# Patient Record
Sex: Female | Born: 1960 | Race: Black or African American | Hispanic: No | Smoking: Never smoker
Health system: Southern US, Community
[De-identification: ages and names within clinical notes are randomized; demographics above are authoritative.]

## PROBLEM LIST (undated history)

## (undated) DIAGNOSIS — I10 Essential (primary) hypertension: Secondary | ICD-10-CM

## (undated) DIAGNOSIS — E785 Hyperlipidemia, unspecified: Secondary | ICD-10-CM

## (undated) DIAGNOSIS — E669 Obesity, unspecified: Secondary | ICD-10-CM

## (undated) HISTORY — PX: OTHER SURGICAL HISTORY: SHX169

## (undated) HISTORY — PX: TONSILECTOMY, ADENOIDECTOMY, BILATERAL MYRINGOTOMY AND TUBES: SHX2538

## (undated) HISTORY — DX: Essential (primary) hypertension: I10

## (undated) HISTORY — PX: CHOLECYSTECTOMY: SHX55

## (undated) HISTORY — DX: Hyperlipidemia, unspecified: E78.5

## (undated) HISTORY — DX: Obesity, unspecified: E66.9

---

## 1999-03-15 ENCOUNTER — Ambulatory Visit: Admission: RE | Admit: 1999-03-15 | Discharge: 1999-03-15 | Payer: Self-pay | Admitting: Otolaryngology

## 1999-04-28 ENCOUNTER — Encounter: Payer: Self-pay | Admitting: Otolaryngology

## 1999-04-30 ENCOUNTER — Inpatient Hospital Stay (HOSPITAL_COMMUNITY): Admission: RE | Admit: 1999-04-30 | Discharge: 1999-05-02 | Payer: Self-pay | Admitting: Otolaryngology

## 1999-04-30 ENCOUNTER — Encounter (INDEPENDENT_AMBULATORY_CARE_PROVIDER_SITE_OTHER): Payer: Self-pay | Admitting: Specialist

## 1999-09-02 ENCOUNTER — Ambulatory Visit (HOSPITAL_BASED_OUTPATIENT_CLINIC_OR_DEPARTMENT_OTHER): Admission: RE | Admit: 1999-09-02 | Discharge: 1999-09-02 | Payer: Self-pay | Admitting: Otolaryngology

## 1999-11-23 ENCOUNTER — Ambulatory Visit (HOSPITAL_BASED_OUTPATIENT_CLINIC_OR_DEPARTMENT_OTHER): Admission: RE | Admit: 1999-11-23 | Discharge: 1999-11-23 | Payer: Self-pay | Admitting: Otolaryngology

## 2001-05-18 ENCOUNTER — Other Ambulatory Visit: Admission: RE | Admit: 2001-05-18 | Discharge: 2001-05-18 | Payer: Self-pay | Admitting: Family Medicine

## 2001-07-12 ENCOUNTER — Encounter: Payer: Self-pay | Admitting: Family Medicine

## 2001-07-12 ENCOUNTER — Ambulatory Visit (HOSPITAL_COMMUNITY): Admission: RE | Admit: 2001-07-12 | Discharge: 2001-07-12 | Payer: Self-pay | Admitting: Family Medicine

## 2001-11-27 ENCOUNTER — Encounter: Payer: Self-pay | Admitting: Family Medicine

## 2001-11-27 ENCOUNTER — Ambulatory Visit (HOSPITAL_COMMUNITY): Admission: RE | Admit: 2001-11-27 | Discharge: 2001-11-27 | Payer: Self-pay | Admitting: Family Medicine

## 2001-12-03 ENCOUNTER — Encounter: Payer: Self-pay | Admitting: Family Medicine

## 2001-12-03 ENCOUNTER — Encounter (HOSPITAL_COMMUNITY): Admission: RE | Admit: 2001-12-03 | Discharge: 2002-01-02 | Payer: Self-pay | Admitting: Family Medicine

## 2002-01-18 ENCOUNTER — Observation Stay (HOSPITAL_COMMUNITY): Admission: RE | Admit: 2002-01-18 | Discharge: 2002-01-19 | Payer: Self-pay | Admitting: General Surgery

## 2002-03-21 ENCOUNTER — Ambulatory Visit (HOSPITAL_COMMUNITY): Admission: RE | Admit: 2002-03-21 | Discharge: 2002-03-21 | Payer: Self-pay | Admitting: Family Medicine

## 2002-03-21 ENCOUNTER — Encounter: Payer: Self-pay | Admitting: Family Medicine

## 2002-05-27 ENCOUNTER — Emergency Department (HOSPITAL_COMMUNITY): Admission: EM | Admit: 2002-05-27 | Discharge: 2002-05-27 | Payer: Self-pay | Admitting: Emergency Medicine

## 2002-05-27 ENCOUNTER — Encounter: Payer: Self-pay | Admitting: Emergency Medicine

## 2002-07-19 ENCOUNTER — Ambulatory Visit (HOSPITAL_COMMUNITY): Admission: RE | Admit: 2002-07-19 | Discharge: 2002-07-19 | Payer: Self-pay | Admitting: Family Medicine

## 2002-08-07 ENCOUNTER — Encounter: Payer: Self-pay | Admitting: Family Medicine

## 2002-08-07 ENCOUNTER — Ambulatory Visit (HOSPITAL_COMMUNITY): Admission: RE | Admit: 2002-08-07 | Discharge: 2002-08-07 | Payer: Self-pay | Admitting: Family Medicine

## 2002-09-04 ENCOUNTER — Ambulatory Visit (HOSPITAL_COMMUNITY): Admission: RE | Admit: 2002-09-04 | Discharge: 2002-09-04 | Payer: Self-pay | Admitting: General Surgery

## 2004-02-25 ENCOUNTER — Ambulatory Visit: Payer: Self-pay | Admitting: Family Medicine

## 2004-03-29 ENCOUNTER — Emergency Department: Payer: Self-pay | Admitting: Internal Medicine

## 2004-06-28 ENCOUNTER — Ambulatory Visit: Payer: Self-pay | Admitting: Family Medicine

## 2004-09-01 ENCOUNTER — Ambulatory Visit: Payer: Self-pay | Admitting: Family Medicine

## 2004-10-12 ENCOUNTER — Emergency Department: Payer: Self-pay | Admitting: Emergency Medicine

## 2004-11-25 ENCOUNTER — Ambulatory Visit (HOSPITAL_COMMUNITY): Admission: RE | Admit: 2004-11-25 | Discharge: 2004-11-25 | Payer: Self-pay | Admitting: Family Medicine

## 2004-11-25 ENCOUNTER — Ambulatory Visit: Payer: Self-pay | Admitting: Family Medicine

## 2005-02-24 ENCOUNTER — Ambulatory Visit: Payer: Self-pay | Admitting: Family Medicine

## 2005-04-26 ENCOUNTER — Ambulatory Visit: Payer: Self-pay | Admitting: Family Medicine

## 2005-07-04 ENCOUNTER — Ambulatory Visit: Payer: Self-pay | Admitting: Family Medicine

## 2005-08-16 ENCOUNTER — Emergency Department: Payer: Self-pay | Admitting: Emergency Medicine

## 2005-08-16 ENCOUNTER — Other Ambulatory Visit: Payer: Self-pay

## 2005-08-31 ENCOUNTER — Ambulatory Visit: Payer: Self-pay | Admitting: Family Medicine

## 2005-09-05 ENCOUNTER — Ambulatory Visit (HOSPITAL_COMMUNITY): Admission: RE | Admit: 2005-09-05 | Discharge: 2005-09-05 | Payer: Self-pay | Admitting: Family Medicine

## 2005-10-04 ENCOUNTER — Encounter: Payer: Self-pay | Admitting: Neurosurgery

## 2005-10-05 ENCOUNTER — Encounter: Payer: Self-pay | Admitting: Neurosurgery

## 2005-10-06 ENCOUNTER — Ambulatory Visit: Payer: Self-pay | Admitting: Family Medicine

## 2005-12-26 ENCOUNTER — Ambulatory Visit: Payer: Self-pay | Admitting: Family Medicine

## 2006-02-15 ENCOUNTER — Ambulatory Visit: Payer: Self-pay | Admitting: Family Medicine

## 2006-03-14 ENCOUNTER — Other Ambulatory Visit: Admission: RE | Admit: 2006-03-14 | Discharge: 2006-03-14 | Payer: Self-pay | Admitting: Family Medicine

## 2006-03-14 ENCOUNTER — Ambulatory Visit: Payer: Self-pay | Admitting: Family Medicine

## 2006-03-15 ENCOUNTER — Encounter: Payer: Self-pay | Admitting: Family Medicine

## 2006-03-15 LAB — CONVERTED CEMR LAB
Chlamydia, DNA Probe: NEGATIVE
GC Probe Amp, Genital: NEGATIVE

## 2006-06-01 ENCOUNTER — Ambulatory Visit: Payer: Self-pay | Admitting: Family Medicine

## 2006-06-01 LAB — CONVERTED CEMR LAB
ALT: 14 units/L (ref 0–35)
AST: 15 units/L (ref 0–37)
Albumin: 4 g/dL (ref 3.5–5.2)
Alkaline Phosphatase: 55 units/L (ref 39–117)
BUN: 9 mg/dL (ref 6–23)
Bilirubin, Direct: 0.1 mg/dL (ref 0.0–0.3)
CO2: 24 meq/L (ref 19–32)
Calcium: 8.9 mg/dL (ref 8.4–10.5)
Chloride: 102 meq/L (ref 96–112)
Cholesterol: 197 mg/dL (ref 0–200)
Creatinine, Ser: 0.6 mg/dL (ref 0.40–1.20)
Glucose, Bld: 118 mg/dL — ABNORMAL HIGH (ref 70–99)
HDL: 42 mg/dL (ref >39)
Hgb A1c MFr Bld: 7.1 % — ABNORMAL HIGH (ref 4.6–6.1)
Indirect Bilirubin: 0.3 mg/dL (ref 0.0–0.9)
LDL Cholesterol: 136 mg/dL — ABNORMAL HIGH (ref 0–99)
Potassium: 4.1 meq/L (ref 3.5–5.3)
Sodium: 139 meq/L (ref 135–145)
Total Bilirubin: 0.4 mg/dL (ref 0.3–1.2)
Total CHOL/HDL Ratio: 4.7
Total Protein: 7.3 g/dL (ref 6.0–8.3)
Triglycerides: 95 mg/dL (ref <150)
VLDL: 19 mg/dL (ref 0–40)

## 2006-06-06 LAB — HM COLONOSCOPY: HM Colonoscopy: NORMAL

## 2006-06-29 ENCOUNTER — Ambulatory Visit: Payer: Self-pay | Admitting: Internal Medicine

## 2006-06-29 ENCOUNTER — Ambulatory Visit (HOSPITAL_COMMUNITY): Admission: RE | Admit: 2006-06-29 | Discharge: 2006-06-29 | Payer: Self-pay | Admitting: Internal Medicine

## 2006-07-25 ENCOUNTER — Ambulatory Visit: Payer: Self-pay | Admitting: Family Medicine

## 2006-08-16 ENCOUNTER — Ambulatory Visit: Payer: Self-pay | Admitting: Family Medicine

## 2006-08-17 ENCOUNTER — Ambulatory Visit: Payer: Self-pay

## 2006-11-27 ENCOUNTER — Ambulatory Visit: Payer: Self-pay | Admitting: Family Medicine

## 2006-11-27 LAB — CONVERTED CEMR LAB
ALT: 10 units/L (ref 0–35)
AST: 12 units/L (ref 0–37)
Albumin: 4.1 g/dL (ref 3.5–5.2)
Alkaline Phosphatase: 59 units/L (ref 39–117)
BUN: 8 mg/dL (ref 6–23)
Basophils Absolute: 0 10*3/uL (ref 0.0–0.1)
Basophils Relative: 0 % (ref 0–1)
Bilirubin, Direct: 0.1 mg/dL (ref 0.0–0.3)
CO2: 23 meq/L (ref 19–32)
Calcium: 9 mg/dL (ref 8.4–10.5)
Chloride: 103 meq/L (ref 96–112)
Cholesterol: 208 mg/dL — ABNORMAL HIGH (ref 0–200)
Creatinine, Ser: 0.61 mg/dL (ref 0.40–1.20)
Eosinophils Absolute: 0.2 10*3/uL (ref 0.0–0.7)
Eosinophils Relative: 2 % (ref 0–5)
Glucose, Bld: 104 mg/dL — ABNORMAL HIGH (ref 70–99)
HCT: 40.5 % (ref 36.0–46.0)
HDL: 50 mg/dL (ref >39)
Hemoglobin: 12.9 g/dL (ref 12.0–15.0)
Indirect Bilirubin: 0.4 mg/dL (ref 0.0–0.9)
LDL Cholesterol: 144 mg/dL — ABNORMAL HIGH (ref 0–99)
Lymphocytes Relative: 48 % — ABNORMAL HIGH (ref 12–46)
Lymphs Abs: 4.1 10*3/uL — ABNORMAL HIGH (ref 0.7–3.3)
MCHC: 31.9 g/dL (ref 30.0–36.0)
MCV: 83.5 fL (ref 78.0–100.0)
Monocytes Absolute: 0.4 10*3/uL (ref 0.2–0.7)
Monocytes Relative: 5 % (ref 3–11)
Neutro Abs: 3.9 10*3/uL (ref 1.7–7.7)
Neutrophils Relative %: 45 % (ref 43–77)
Platelets: 343 10*3/uL (ref 150–400)
Potassium: 4.6 meq/L (ref 3.5–5.3)
RBC: 4.85 M/uL (ref 3.87–5.11)
RDW: 15.4 % — ABNORMAL HIGH (ref 11.5–14.0)
Sodium: 140 meq/L (ref 135–145)
Total Bilirubin: 0.5 mg/dL (ref 0.3–1.2)
Total CHOL/HDL Ratio: 4.2
Total Protein: 7.7 g/dL (ref 6.0–8.3)
Triglycerides: 71 mg/dL (ref <150)
VLDL: 14 mg/dL (ref 0–40)
WBC: 8.7 10*3/uL (ref 4.0–10.5)

## 2007-01-03 ENCOUNTER — Ambulatory Visit: Payer: Self-pay | Admitting: Family Medicine

## 2007-01-03 ENCOUNTER — Ambulatory Visit (HOSPITAL_COMMUNITY): Admission: RE | Admit: 2007-01-03 | Discharge: 2007-01-03 | Payer: Self-pay | Admitting: Family Medicine

## 2007-04-02 ENCOUNTER — Other Ambulatory Visit: Admission: RE | Admit: 2007-04-02 | Discharge: 2007-04-02 | Payer: Self-pay | Admitting: Family Medicine

## 2007-04-02 ENCOUNTER — Encounter: Payer: Self-pay | Admitting: Family Medicine

## 2007-04-02 ENCOUNTER — Ambulatory Visit: Payer: Self-pay | Admitting: Family Medicine

## 2007-04-02 LAB — CONVERTED CEMR LAB: Pap Smear: NORMAL

## 2007-04-03 ENCOUNTER — Encounter: Payer: Self-pay | Admitting: Family Medicine

## 2007-04-03 LAB — CONVERTED CEMR LAB
Candida species: NEGATIVE
Chlamydia, DNA Probe: NEGATIVE
GC Probe Amp, Genital: NEGATIVE
Gardnerella vaginalis: NEGATIVE
Trichomonal Vaginitis: NEGATIVE

## 2007-05-09 ENCOUNTER — Ambulatory Visit: Payer: Self-pay | Admitting: Family Medicine

## 2007-06-05 ENCOUNTER — Ambulatory Visit: Payer: Self-pay | Admitting: Family Medicine

## 2007-06-05 LAB — CONVERTED CEMR LAB
ALT: 14 units/L (ref 0–35)
AST: 13 units/L (ref 0–37)
Albumin: 4.2 g/dL (ref 3.5–5.2)
Alkaline Phosphatase: 73 units/L (ref 39–117)
BUN: 9 mg/dL (ref 6–23)
Basophils Absolute: 0 10*3/uL (ref 0.0–0.1)
Basophils Relative: 0 % (ref 0–1)
Bilirubin, Direct: <0.1 mg/dL (ref 0.0–0.3)
CO2: 22 meq/L (ref 19–32)
Calcium: 8.8 mg/dL (ref 8.4–10.5)
Chloride: 99 meq/L (ref 96–112)
Cholesterol: 206 mg/dL — ABNORMAL HIGH (ref 0–200)
Creatinine, Ser: 0.56 mg/dL (ref 0.40–1.20)
Eosinophils Absolute: 0.1 10*3/uL (ref 0.0–0.7)
Eosinophils Relative: 2 % (ref 0–5)
Glucose, Bld: 266 mg/dL — ABNORMAL HIGH (ref 70–99)
HCT: 38.8 % (ref 36.0–46.0)
HDL: 44 mg/dL (ref >39)
Hemoglobin: 12.5 g/dL (ref 12.0–15.0)
LDL Cholesterol: 141 mg/dL — ABNORMAL HIGH (ref 0–99)
Lymphocytes Relative: 51 % — ABNORMAL HIGH (ref 12–46)
Lymphs Abs: 3.6 10*3/uL (ref 0.7–4.0)
MCHC: 32.2 g/dL (ref 30.0–36.0)
MCV: 81 fL (ref 78.0–100.0)
Monocytes Absolute: 0.3 10*3/uL (ref 0.1–1.0)
Monocytes Relative: 4 % (ref 3–12)
Neutro Abs: 3 10*3/uL (ref 1.7–7.7)
Neutrophils Relative %: 43 % (ref 43–77)
Platelets: 304 10*3/uL (ref 150–400)
Potassium: 4 meq/L (ref 3.5–5.3)
RBC: 4.79 M/uL (ref 3.87–5.11)
RDW: 14.8 % (ref 11.5–15.5)
Sodium: 138 meq/L (ref 135–145)
Total Bilirubin: 0.4 mg/dL (ref 0.3–1.2)
Total CHOL/HDL Ratio: 4.7
Total Protein: 7.6 g/dL (ref 6.0–8.3)
Triglycerides: 104 mg/dL (ref <150)
VLDL: 21 mg/dL (ref 0–40)
WBC: 7 10*3/uL (ref 4.0–10.5)

## 2007-06-13 ENCOUNTER — Ambulatory Visit: Payer: Self-pay | Admitting: Family Medicine

## 2007-06-13 LAB — CONVERTED CEMR LAB
Chlamydia, DNA Probe: NEGATIVE
GC Probe Amp, Genital: NEGATIVE

## 2007-06-14 ENCOUNTER — Encounter: Payer: Self-pay | Admitting: Family Medicine

## 2007-06-14 LAB — CONVERTED CEMR LAB
Candida species: POSITIVE — AB
Gardnerella vaginalis: NEGATIVE
Trichomonal Vaginitis: NEGATIVE

## 2007-06-21 ENCOUNTER — Ambulatory Visit (HOSPITAL_BASED_OUTPATIENT_CLINIC_OR_DEPARTMENT_OTHER): Admission: RE | Admit: 2007-06-21 | Discharge: 2007-06-21 | Payer: Self-pay | Admitting: Orthopedic Surgery

## 2007-07-10 ENCOUNTER — Encounter: Payer: Self-pay | Admitting: Family Medicine

## 2007-07-10 ENCOUNTER — Ambulatory Visit: Payer: Self-pay | Admitting: Family Medicine

## 2007-07-10 LAB — CONVERTED CEMR LAB
Blood Glucose, Fingerstick: 268
Hgb A1c MFr Bld: 11.1 %

## 2007-08-07 ENCOUNTER — Ambulatory Visit (HOSPITAL_COMMUNITY): Admission: RE | Admit: 2007-08-07 | Discharge: 2007-08-07 | Payer: Self-pay | Admitting: Family Medicine

## 2007-08-07 ENCOUNTER — Ambulatory Visit: Payer: Self-pay | Admitting: Family Medicine

## 2007-08-07 ENCOUNTER — Encounter: Payer: Self-pay | Admitting: Family Medicine

## 2007-08-07 LAB — CONVERTED CEMR LAB: Glucose, Bld: 163 mg/dL

## 2007-09-05 ENCOUNTER — Ambulatory Visit: Payer: Self-pay | Admitting: Family Medicine

## 2007-09-28 ENCOUNTER — Encounter: Payer: Self-pay | Admitting: Family Medicine

## 2007-09-28 DIAGNOSIS — E669 Obesity, unspecified: Secondary | ICD-10-CM | POA: Insufficient documentation

## 2007-09-28 DIAGNOSIS — I10 Essential (primary) hypertension: Secondary | ICD-10-CM | POA: Insufficient documentation

## 2007-09-28 DIAGNOSIS — E785 Hyperlipidemia, unspecified: Secondary | ICD-10-CM | POA: Insufficient documentation

## 2007-09-28 DIAGNOSIS — E119 Type 2 diabetes mellitus without complications: Secondary | ICD-10-CM | POA: Insufficient documentation

## 2007-10-04 ENCOUNTER — Encounter: Payer: Self-pay | Admitting: Family Medicine

## 2007-10-04 ENCOUNTER — Ambulatory Visit: Payer: Self-pay | Admitting: Family Medicine

## 2007-10-04 LAB — CONVERTED CEMR LAB
ALT: 9 units/L (ref 0–35)
AST: 11 units/L (ref 0–37)
Albumin: 4.2 g/dL (ref 3.5–5.2)
Alkaline Phosphatase: 69 units/L (ref 39–117)
BUN: 10 mg/dL (ref 6–23)
Bilirubin, Direct: 0.1 mg/dL (ref 0.0–0.3)
CO2: 23 meq/L (ref 19–32)
Calcium: 9.7 mg/dL (ref 8.4–10.5)
Chloride: 102 meq/L (ref 96–112)
Cholesterol: 203 mg/dL — ABNORMAL HIGH (ref 0–200)
Creatinine, Ser: 0.69 mg/dL (ref 0.40–1.20)
Glucose, Bld: 86 mg/dL (ref 70–99)
HDL: 50 mg/dL (ref >39)
Indirect Bilirubin: 0.2 mg/dL (ref 0.0–0.9)
LDL Cholesterol: 133 mg/dL — ABNORMAL HIGH (ref 0–99)
Potassium: 4.2 meq/L (ref 3.5–5.3)
Sodium: 140 meq/L (ref 135–145)
Total Bilirubin: 0.3 mg/dL (ref 0.3–1.2)
Total CHOL/HDL Ratio: 4.1
Total Protein: 7.7 g/dL (ref 6.0–8.3)
Triglycerides: 98 mg/dL (ref <150)
VLDL: 20 mg/dL (ref 0–40)

## 2007-10-05 ENCOUNTER — Other Ambulatory Visit: Payer: Self-pay

## 2007-10-05 ENCOUNTER — Emergency Department: Payer: Self-pay | Admitting: Emergency Medicine

## 2007-12-31 ENCOUNTER — Ambulatory Visit: Payer: Self-pay | Admitting: Family Medicine

## 2007-12-31 LAB — CONVERTED CEMR LAB
Glucose, Bld: 133 mg/dL
Hgb A1c MFr Bld: 7.3 %

## 2008-01-09 ENCOUNTER — Encounter: Payer: Self-pay | Admitting: Family Medicine

## 2008-04-02 ENCOUNTER — Ambulatory Visit (HOSPITAL_COMMUNITY): Admission: RE | Admit: 2008-04-02 | Discharge: 2008-04-02 | Payer: Self-pay | Admitting: Family Medicine

## 2008-04-02 ENCOUNTER — Ambulatory Visit: Payer: Self-pay | Admitting: Family Medicine

## 2008-04-02 DIAGNOSIS — M79609 Pain in unspecified limb: Secondary | ICD-10-CM | POA: Insufficient documentation

## 2008-04-02 LAB — CONVERTED CEMR LAB: Glucose, Bld: 160 mg/dL

## 2008-11-04 ENCOUNTER — Ambulatory Visit: Payer: Self-pay | Admitting: Family Medicine

## 2008-11-04 LAB — CONVERTED CEMR LAB
Glucose, Bld: 265 mg/dL
Hgb A1c MFr Bld: >14 %

## 2008-11-13 ENCOUNTER — Telehealth: Payer: Self-pay | Admitting: Family Medicine

## 2008-11-17 ENCOUNTER — Encounter: Payer: Self-pay | Admitting: Family Medicine

## 2008-11-17 ENCOUNTER — Telehealth: Payer: Self-pay | Admitting: Family Medicine

## 2008-11-18 LAB — CONVERTED CEMR LAB
ALT: 9 units/L (ref 0–35)
AST: 12 units/L (ref 0–37)
Albumin: 4 g/dL (ref 3.5–5.2)
Alkaline Phosphatase: 90 units/L (ref 39–117)
BUN: 8 mg/dL (ref 6–23)
Bilirubin, Direct: 0.1 mg/dL (ref 0.0–0.3)
CO2: 21 meq/L (ref 19–32)
Calcium: 9.3 mg/dL (ref 8.4–10.5)
Chloride: 104 meq/L (ref 96–112)
Cholesterol: 210 mg/dL — ABNORMAL HIGH (ref 0–200)
Creatinine, Ser: 0.64 mg/dL (ref 0.40–1.20)
Glucose, Bld: 75 mg/dL (ref 70–99)
HDL: 52 mg/dL (ref >39)
Indirect Bilirubin: 0.2 mg/dL (ref 0.0–0.9)
LDL Cholesterol: 129 mg/dL — ABNORMAL HIGH (ref 0–99)
Potassium: 4.4 meq/L (ref 3.5–5.3)
Sodium: 146 meq/L — ABNORMAL HIGH (ref 135–145)
Total Bilirubin: 0.3 mg/dL (ref 0.3–1.2)
Total CHOL/HDL Ratio: 4
Total Protein: 7.6 g/dL (ref 6.0–8.3)
Triglycerides: 146 mg/dL (ref <150)
VLDL: 29 mg/dL (ref 0–40)

## 2008-11-24 ENCOUNTER — Telehealth: Payer: Self-pay | Admitting: Family Medicine

## 2009-03-24 ENCOUNTER — Emergency Department: Payer: Self-pay | Admitting: Emergency Medicine

## 2009-05-10 ENCOUNTER — Emergency Department: Payer: Self-pay | Admitting: Emergency Medicine

## 2009-06-02 ENCOUNTER — Emergency Department: Payer: Self-pay | Admitting: Emergency Medicine

## 2009-06-02 ENCOUNTER — Encounter: Payer: Self-pay | Admitting: Family Medicine

## 2009-06-05 ENCOUNTER — Ambulatory Visit: Payer: Self-pay | Admitting: Family Medicine

## 2009-06-05 DIAGNOSIS — R0789 Other chest pain: Secondary | ICD-10-CM | POA: Insufficient documentation

## 2009-06-10 ENCOUNTER — Telehealth: Payer: Self-pay | Admitting: Family Medicine

## 2009-06-11 ENCOUNTER — Encounter: Payer: Self-pay | Admitting: Physician Assistant

## 2009-07-10 ENCOUNTER — Ambulatory Visit: Payer: Self-pay | Admitting: Family Medicine

## 2009-07-10 DIAGNOSIS — E1149 Type 2 diabetes mellitus with other diabetic neurological complication: Secondary | ICD-10-CM | POA: Insufficient documentation

## 2009-07-21 ENCOUNTER — Encounter: Payer: Self-pay | Admitting: Family Medicine

## 2009-07-21 LAB — CONVERTED CEMR LAB
ALT: 10 units/L (ref 0–35)
AST: 12 units/L (ref 0–37)
Albumin: 4 g/dL (ref 3.5–5.2)
Alkaline Phosphatase: 79 units/L (ref 39–117)
BUN: 10 mg/dL (ref 6–23)
CO2: 24 meq/L (ref 19–32)
Calcium: 9 mg/dL (ref 8.4–10.5)
Chloride: 104 meq/L (ref 96–112)
Creatinine, Ser: 0.56 mg/dL (ref 0.40–1.20)
Glucose, Bld: 293 mg/dL — ABNORMAL HIGH (ref 70–99)
Magnesium: 1.9 mg/dL (ref 1.5–2.5)
Potassium: 4.5 meq/L (ref 3.5–5.3)
Sodium: 139 meq/L (ref 135–145)
Total Bilirubin: 0.3 mg/dL (ref 0.3–1.2)
Total Protein: 7 g/dL (ref 6.0–8.3)

## 2009-08-11 ENCOUNTER — Ambulatory Visit: Payer: Self-pay | Admitting: Family Medicine

## 2009-08-11 DIAGNOSIS — B356 Tinea cruris: Secondary | ICD-10-CM | POA: Insufficient documentation

## 2009-08-11 LAB — CONVERTED CEMR LAB: Glucose, Bld: 289 mg/dL

## 2009-08-12 ENCOUNTER — Encounter: Payer: Self-pay | Admitting: Physician Assistant

## 2009-08-12 LAB — CONVERTED CEMR LAB
Candida species: NEGATIVE
Gardnerella vaginalis: NEGATIVE

## 2009-08-18 ENCOUNTER — Telehealth: Payer: Self-pay | Admitting: Family Medicine

## 2009-08-20 LAB — CONVERTED CEMR LAB: Hgb A1c MFr Bld: 12.5 % — ABNORMAL HIGH (ref <5.7)

## 2009-10-05 ENCOUNTER — Encounter: Payer: Self-pay | Admitting: Physician Assistant

## 2009-10-15 ENCOUNTER — Encounter: Payer: Self-pay | Admitting: Family Medicine

## 2009-10-16 ENCOUNTER — Telehealth: Payer: Self-pay | Admitting: Physician Assistant

## 2009-10-23 ENCOUNTER — Encounter: Payer: Self-pay | Admitting: Family Medicine

## 2009-11-05 ENCOUNTER — Ambulatory Visit: Payer: Self-pay | Admitting: Family Medicine

## 2009-12-07 ENCOUNTER — Other Ambulatory Visit: Admission: RE | Admit: 2009-12-07 | Discharge: 2009-12-07 | Payer: Self-pay | Admitting: Family Medicine

## 2009-12-07 ENCOUNTER — Ambulatory Visit: Payer: Self-pay | Admitting: Family Medicine

## 2009-12-07 DIAGNOSIS — IMO0002 Reserved for concepts with insufficient information to code with codable children: Secondary | ICD-10-CM | POA: Insufficient documentation

## 2009-12-08 ENCOUNTER — Encounter: Payer: Self-pay | Admitting: Family Medicine

## 2009-12-08 LAB — CONVERTED CEMR LAB
Creatinine, Urine: 98.9 mg/dL
Microalb Creat Ratio: 5.1 mg/g (ref 0.0–30.0)
Microalb, Ur: 0.5 mg/dL (ref 0.00–1.89)

## 2009-12-11 ENCOUNTER — Encounter: Payer: Self-pay | Admitting: Family Medicine

## 2009-12-15 ENCOUNTER — Ambulatory Visit (HOSPITAL_COMMUNITY): Admission: RE | Admit: 2009-12-15 | Discharge: 2009-12-15 | Payer: Self-pay | Admitting: Family Medicine

## 2009-12-16 ENCOUNTER — Encounter: Payer: Self-pay | Admitting: Family Medicine

## 2009-12-16 LAB — CONVERTED CEMR LAB
ALT: 10 units/L (ref 0–35)
AST: 13 units/L (ref 0–37)
Albumin: 3.9 g/dL (ref 3.5–5.2)
Alkaline Phosphatase: 63 units/L (ref 39–117)
BUN: 9 mg/dL (ref 6–23)
Bilirubin, Direct: 0.1 mg/dL (ref 0.0–0.3)
CO2: 25 meq/L (ref 19–32)
Calcium: 8.8 mg/dL (ref 8.4–10.5)
Chloride: 103 meq/L (ref 96–112)
Cholesterol: 193 mg/dL (ref 0–200)
Creatinine, Ser: 0.64 mg/dL (ref 0.40–1.20)
Glucose, Bld: 153 mg/dL — ABNORMAL HIGH (ref 70–99)
HCT: 37.1 % (ref 36.0–46.0)
HDL: 39 mg/dL — ABNORMAL LOW (ref >39)
Hemoglobin: 11.7 g/dL — ABNORMAL LOW (ref 12.0–15.0)
Hgb A1c MFr Bld: 9.3 % — ABNORMAL HIGH (ref <5.7)
Indirect Bilirubin: 0.3 mg/dL (ref 0.0–0.9)
LDL Cholesterol: 141 mg/dL — ABNORMAL HIGH (ref 0–99)
MCHC: 31.5 g/dL (ref 30.0–36.0)
MCV: 82.6 fL (ref 78.0–100.0)
Platelets: 335 10*3/uL (ref 150–400)
Potassium: 4.3 meq/L (ref 3.5–5.3)
RBC: 4.49 M/uL (ref 3.87–5.11)
RDW: 14.8 % (ref 11.5–15.5)
Sodium: 140 meq/L (ref 135–145)
TSH: 2.248 microintl units/mL (ref 0.350–4.500)
Total Bilirubin: 0.4 mg/dL (ref 0.3–1.2)
Total CHOL/HDL Ratio: 4.9
Total Protein: 7 g/dL (ref 6.0–8.3)
Triglycerides: 65 mg/dL (ref <150)
VLDL: 13 mg/dL (ref 0–40)
WBC: 7 10*3/uL (ref 4.0–10.5)

## 2010-02-09 ENCOUNTER — Ambulatory Visit: Payer: Self-pay | Admitting: Family Medicine

## 2010-02-09 ENCOUNTER — Telehealth: Payer: Self-pay | Admitting: Family Medicine

## 2010-02-09 LAB — HM DIABETES FOOT EXAM

## 2010-02-12 ENCOUNTER — Encounter: Payer: Self-pay | Admitting: Family Medicine

## 2010-03-28 ENCOUNTER — Encounter: Payer: Self-pay | Admitting: Family Medicine

## 2010-04-04 LAB — CONVERTED CEMR LAB
OCCULT 1: NEGATIVE
Pap Smear: NEGATIVE

## 2010-04-06 NOTE — Assessment & Plan Note (Signed)
Summary: follow up -room 1   Vital Signs:  Patient profile:   50 year old female Menstrual status:  regular Height:      64 inches Weight:      266.75 pounds BMI:     45.95 O2 Sat:      99 % on Room air Pulse rate:   74 / minute Resp:     16 per minute BP sitting:   152 / 92  (left arm)  Vitals Entered By: Adella Hare LPN (Jul 10, 1608 8:37 AM)  Serial Vital Signs/Assessments:  Time      Position  BP       Pulse  Resp  Temp     By                     170/104                        Esperanza Sheets PA  CC: follow up Is Patient Diabetic? Yes Pain Assessment Patient in pain? no        CC:  follow up.  History of Present Illness: Hx of DM.  No taking medications as prescribed. "I can't afford it." She takes her meds every other day to stretch out a rprescription.  No episodes of hypoglycemia. Checking blood sugar at home approx every other day.  When she checks her fasting sugars they are always in the 200's.  Hx of htn. Taking medications as prescribed. No headache, chest pain or palpitations. BP at home ranges 140's -160's / 90's.  sometimes diastolic in 60's though.  Has persistent numbness on the lateral aspect of her Lt foot, plantar aspect.  States it has been like that x approx a yr.  No progression.  Sometimes is painful or achey.  No LBP.  Is unemployed. Having financial difficulty.  Denies feeling depressed, anxious, or problems with insomnia.    Current Medications (verified): 1)  Metformin Hcl 1000 Mg Tabs (Metformin Hcl) .... Take 1 Tablet By Mouth Two Times A Day 2)  Glipizide 5 Mg Tabs (Glipizide) .... Take 1 Tablet By Mouth Two Times A Day 3)  Lisinopril 20 Mg Tabs (Lisinopril) .... One Tab By Mouth Once Daily 4)  Microzide 12.5 Mg Caps (Hydrochlorothiazide) .... Take One Caps Every Morning For Blood Pressure  Allergies (verified): No Known Drug Allergies  Past History:  Past medical history reviewed for relevance to current acute and chronic  problems.  Past Medical History: Reviewed history from 09/28/2007 and no changes required.   HYPERLIPIDEMIA (ICD-272.4) OBESITY (ICD-278.00) DM (ICD-250.00) HYPERTENSION (ICD-401.9)  Review of Systems General:  Denies chills, fever, and weakness. CV:  Denies chest pain or discomfort and palpitations. Resp:  Denies shortness of breath. MS:  Denies joint pain, low back pain, and mid back pain. Neuro:  Complains of numbness; denies tingling. Psych:  Denies anxiety, depression, and mental problems. Endo:  Denies excessive thirst and excessive urination.  Physical Exam  General:  Well-developed,well-nourished,in no acute distress; alert,appropriate and cooperative throughout examination Head:  Normocephalic and atraumatic without obvious abnormalities. No apparent alopecia or balding. Eyes:  pupils equal, pupils round, and pupils reactive to light.   Ears:  External ear exam shows no significant lesions or deformities.  Otoscopic examination reveals clear canals, tympanic membranes are intact bilaterally without bulging, retraction, inflammation or discharge. Hearing is grossly normal bilaterally. Nose:  External nasal examination shows no deformity or inflammation. Nasal mucosa are pink  and moist without lesions or exudates. Mouth:  Oral mucosa and oropharynx withoutTeeth in good repair. Neck:  No deformities, masses, or tenderness noted. Lungs:  Normal respiratory effort, chest expands symmetrically. Lungs are clear to auscultation, no crackles or wheezes. Heart:  Normal rate and regular rhythm. S1 and S2 normal without gallop, murmur, click, rub or other extra sounds. Pulses:  R posterior tibial normal, R dorsalis pedis normal, L posterior tibial normal, and L dorsalis pedis normal.   Extremities:  No clubbing, cyanosis, edema, or deformity noted with normal full range of motion of all joints.   Neurologic:  alert & oriented X3, strength normal in all extremities, and gait normal.  DTR  +1/4 bilat patellar and achilles. Skin:  Intact without suspicious lesions or rashes Cervical Nodes:  No lymphadenopathy noted Psych:  Cognition and judgment appear intact. Alert and cooperative with normal attention span and concentration. No apparent delusions, illusions, hallucinations  Diabetes Management Exam:    Foot Exam (with socks and/or shoes not present):       Sensory-Monofilament:          Left foot: absent          Right foot: normal       Sensory-other: Monofilament absent Lateral Lt foot only       Inspection:          Left foot: normal          Right foot: normal       Nails:          Left foot: normal          Right foot: normal   Impression & Recommendations:  Problem # 1:  DM (ICD-250.00) Assessment Unchanged Discussed with pt the importance of improved blood sugar control to prevent complications from diabetes. Encouraged pt to take her metformin every day.  This prescription is less costly for her than glipizide. Encouraged pt to take her Glipizide at least once a day, instead of two times a day every other day as she has been taking.  Her updated medication list for this problem includes:    Metformin Hcl 1000 Mg Tabs (Metformin hcl) .Marland Kitchen... Take 1 tablet by mouth two times a day    Glipizide 5 Mg Tabs (Glipizide) .Marland Kitchen... Take 1 tablet by mouth two times a day    Lisinopril 20 Mg Tabs (Lisinopril) ..... One tab by mouth once daily    Lisinopril-hydrochlorothiazide 20-12.5 Mg Tabs (Lisinopril-hydrochlorothiazide) .Marland Kitchen... Take 2 every morning for high blood pressure  Problem # 2:  HYPERTENSION (ICD-401.9) Assessment: Unchanged Pt was on Exforge & Tekturn[employer] in past.  But was unable to afford her meds. I will try increasing her current meds, but she may need to appy for pt assistance thru the drug companies if blood pressure does not improve at her next visit.  Her updated medication list for this problem includes:    Lisinopril 20 Mg Tabs (Lisinopril) ..... One tab  by mouth once daily    Microzide 12.5 Mg Caps (Hydrochlorothiazide) .Marland Kitchen... Take one caps every morning for blood pressure    Lisinopril-hydrochlorothiazide 20-12.5 Mg Tabs (Lisinopril-hydrochlorothiazide) .Marland Kitchen... Take 2 every morning for high blood pressure  BP today: 152/92 Prior BP: 140/80 (06/05/2009)  Labs Reviewed: K+: 4.4 (11/17/2008) Creat: : 0.64 (11/17/2008)   Chol: 210 (11/17/2008)   HDL: 52 (11/17/2008)   LDL: 129 (11/17/2008)   TG: 146 (11/17/2008)  Problem # 3:  OBESITY (ICD-278.00) Assessment: Comment Only  Ht: 64 (07/10/2009)   Wt:  266.75 (07/10/2009)   BMI: 45.95 (07/10/2009)  Problem # 4:  DIABETIC PERIPHERAL NEUROPATHY (ICD-250.60) Assessment: New Discussed with pt that it is very important to get her blood sugarsto goal to prevent worsening of her neuropathy, but also to prevent other complications.  Her updated medication list for this problem includes:    Metformin Hcl 1000 Mg Tabs (Metformin hcl) .Marland Kitchen... Take 1 tablet by mouth two times a day    Glipizide 5 Mg Tabs (Glipizide) .Marland Kitchen... Take 1 tablet by mouth two times a day    Lisinopril 20 Mg Tabs (Lisinopril) ..... One tab by mouth once daily    Lisinopril-hydrochlorothiazide 20-12.5 Mg Tabs (Lisinopril-hydrochlorothiazide) .Marland Kitchen... Take 2 every morning for high blood pressure  Complete Medication List: 1)  Metformin Hcl 1000 Mg Tabs (Metformin hcl) .... Take 1 tablet by mouth two times a day 2)  Glipizide 5 Mg Tabs (Glipizide) .... Take 1 tablet by mouth two times a day 3)  Lisinopril 20 Mg Tabs (Lisinopril) .... One tab by mouth once daily 4)  Microzide 12.5 Mg Caps (Hydrochlorothiazide) .... Take one caps every morning for blood pressure 5)  Lisinopril-hydrochlorothiazide 20-12.5 Mg Tabs (Lisinopril-hydrochlorothiazide) .... Take 2 every morning for high blood pressure  Patient Instructions: 1)  Please schedule a follow-up appointment in 1 month. 2)  It is important that you exercise regularly at least 20 minutes 5  times a week. If you develop chest pain, have severe difficulty breathing, or feel very tired , stop exercising immediately and seek medical attention. 3)  You need to lose weight. Consider a lower calorie diet and regular exercise.  4)  Take 2 Lisinopril 20 mg tabs and 2 hydrochlorothiazide 12.5 mg tabs every morning to finish the medication you have at home.  Then start the new prescription of Lisinopril HCT 20/12.5 and take 2 every morning. 5)  Take your Metformin twice a day every day.  This is very important to try to get your blood sugars under control. 6)  We will try decreasing your Glipizide to once daily in the morning to try to help with some of your medication expenses. 7)  Check your blood sugars regularly. If your readings are usually above 250 or below 70 you should contact our office. Prescriptions: LISINOPRIL-HYDROCHLOROTHIAZIDE 20-12.5 MG TABS (LISINOPRIL-HYDROCHLOROTHIAZIDE) take 2 every morning for high blood pressure  #180 x 3   Entered and Authorized by:   Esperanza Sheets PA   Signed by:   Esperanza Sheets PA on 07/10/2009   Method used:   Electronically to        [facility]  [address] (retail)       [address]       [place], [place]  [address]       Ph: [phone]       Fax: [phone]   RxID:   [id number]

## 2010-04-06 NOTE — Assessment & Plan Note (Signed)
   Vital Signs:  Patient Profile:   50 Years Old Female Height:     64 inches Weight:      279.6 pounds BMI:     48.17 Pulse rate:   96 / minute Resp:     16 per minute BP sitting:   142 / 84  (right arm)  Vitals Entered By: Chipper Herb (August 07, 2007 2:30 PM)                 Chief Complaint:  check up.            see paper chart Laboratory Results   Blood Tests   Date/Time Received: August 07, 2007 2:31 PM Date/Time Reported: August 07, 2007 2:31 PM  Glucose (random): 163 mg/dL   (Normal Range: 78-295)

## 2010-04-06 NOTE — Assessment & Plan Note (Signed)
Summary: vaginal itching and irritation- room 1   Vital Signs:  Patient profile:   50 year old female Menstrual status:  regular Height:      64 inches Weight:      262.25 pounds BMI:     45.18 O2 Sat:      98 % on Room air Pulse rate:   90 / minute Resp:     16 per minute BP sitting:   170 / 100  (left arm)  Vitals Entered By: Adella Hare LPN (August 11, 1608 1:21 PM)  Nutrition Counseling: Patient's BMI is greater than 25 and therefore counseled on weight management options.  Serial Vital Signs/Assessments:  Time      Position  BP       Pulse  Resp  Temp     By                     154/90                         Adella Hare LPN  CC: vaginal itching and irritation Is Patient Diabetic? No Pain Assessment Patient in pain? no      Comments did not bring meds to ov   CC:  vaginal itching and irritation.  History of Present Illness: Pt complains of external genital itching x 2 mos.  No vag dischg or vaginal itching. Has not been sexually active since 2008.  Pt is diabetic.  She states she is now taking her Metformin two times a day as prescribed.  She is also taking her Glipizide daily.  She hasn't checked her blood sugars the last couple of mornings, but when she does it is still running mid to upper 200's.  Pt also states she has been taking her BP meds every day. She denies HA, chest pain or pressure, or palpitations.    Allergies (verified): No Known Drug Allergies  Past History:  Past medical history reviewed for relevance to current acute and chronic problems.  Past Medical History: Reviewed history from 09/28/2007 and no changes required.   HYPERLIPIDEMIA (ICD-272.4) OBESITY (ICD-278.00) DM (ICD-250.00) HYPERTENSION (ICD-401.9)  Review of Systems CV:  Denies chest pain or discomfort, palpitations, and swelling of feet. Resp:  Denies shortness of breath. GU:  Denies abnormal vaginal bleeding, discharge, and dysuria. Psych:  Complains of anxiety;  denies depression; C/O INCREASED STRESS, HAS BEEN SEPERATED FROM HER HUSBAND X > 3 YRS & HAD AN ARGUMENT WITH HIM BY PHONE A FEW DAYS AGO.  SHE HAS NOT SEEN HIM X ALMOST 2 YRS.  SHE WANTS A DIVORCE. "HE WONT GIVE ME ONE.".  Physical Exam  General:  Well-developed,well-nourished,in no acute distress; alert,appropriate and cooperative throughout examination Head:  Normocephalic and atraumatic without obvious abnormalities. No apparent alopecia or balding. Lungs:  Normal respiratory effort, chest expands symmetrically. Lungs are clear to auscultation, no crackles or wheezes. Heart:  Normal rate and regular rhythm. S1 and S2 normal without gallop, murmur, click, rub or other extra sounds. Genitalia:  Normal introitus for age, no external lesions, no vaginal discharge, mucosa pink and moist, no vaginal or cervical lesions, no vaginal atrophy, no friaility or hemorrhage, normal uterus size and position, no adnexal masses or tenderness Skin:  Groin:  mildly erythematous, shiny/white changes in groin creases. Psych:  Cognition and judgment appear intact. Alert and cooperative with normal attention span and concentration. No apparent delusions, illusions, hallucinations   Impression & Recommendations:  Problem #  1:  DM (ICD-250.00) Assessment Comment Only Pts blood sugar continues to be uncontrolled.  Discussed again with pt the importance of control.  She reports improved compliance in the last 1mos. HgbA1C again ordered today.  Pt has not had drawn due to financial issues, & has no ins.  The following medications were removed from the medication list:    Lisinopril 20 Mg Tabs (Lisinopril) ..... One tab by mouth once daily Her updated medication list for this problem includes:    Metformin Hcl 1000 Mg Tabs (Metformin hcl) .Marland Kitchen... Take 1 tablet by mouth two times a day    Glipizide 5 Mg Tabs (Glipizide) .Marland Kitchen... Take 1 tablet by mouth two times a day    Lisinopril-hydrochlorothiazide 20-12.5 Mg Tabs  (Lisinopril-hydrochlorothiazide) .Marland Kitchen... Take 2 every morning for high blood pressure  Orders: Glucose, (CBG) (82962) T- Hemoglobin A1C (09811-91478)  Problem # 2:  HYPERTENSION (ICD-401.9) Assessment: Unchanged DASH diet handout given.  The following medications were removed from the medication list:    Lisinopril 20 Mg Tabs (Lisinopril) ..... One tab by mouth once daily    Microzide 12.5 Mg Caps (Hydrochlorothiazide) .Marland Kitchen... Take one caps every morning for blood pressure Her updated medication list for this problem includes:    Lisinopril-hydrochlorothiazide 20-12.5 Mg Tabs (Lisinopril-hydrochlorothiazide) .Marland Kitchen... Take 2 every morning for high blood pressure  BP today: 170/100, recheck 154/90 Prior BP: 152/92 (07/10/2009)  Labs Reviewed: K+: 4.5 (07/10/2009) Creat: : 0.56 (07/10/2009)   Chol: 210 (11/17/2008)   HDL: 52 (11/17/2008)   LDL: 129 (11/17/2008)   TG: 146 (11/17/2008)  Problem # 3:  TINEA CRURIS (ICD-110.3) Assessment: New Discussed with pt that her uncontrolled diabetes can be contributing to this. Rx Mycolog II cream.  Also advised pt to try to keep the area clean and dry.  Complete Medication List: 1)  Metformin Hcl 1000 Mg Tabs (Metformin hcl) .... Take 1 tablet by mouth two times a day 2)  Glipizide 5 Mg Tabs (Glipizide) .... Take 1 tablet by mouth two times a day 3)  Lisinopril-hydrochlorothiazide 20-12.5 Mg Tabs (Lisinopril-hydrochlorothiazide) .... Take 2 every morning for high blood pressure 4)  Nystatin-triamcinolone 100000-0.1 Unit/gm-% Crea (Nystatin-triamcinolone) .... Apply to affected area two times a day as needed  Other Orders: T-Wet Prep by [facility] [phone])  Patient Instructions: 1)  Keep your appt in July. 2)  I have prescribed a cream for you to use. 3)  It is important that your Diabetic A1c level is checked every 3 months.  You have received a lab order for this today. 4)  I will check on other medication options for you for your  diabetes as we discussed. Prescriptions: NYSTATIN-TRIAMCINOLONE 100000-0.1 UNIT/GM-% CREA (NYSTATIN-TRIAMCINOLONE) apply to affected area two times a day as needed  #30 grams x 1   Entered and Authorized by:   Esperanza Sheets PA   Signed by:   Esperanza Sheets PA on 08/11/2009   Method used:   Electronically to        [facility]  [address] (retail)       [address]       [place], [place]  [address]       Ph: [phone]       Fax: [phone]   RxID:   [id number]   Laboratory Results   Blood Tests   Date/Time Received: August 11, 2009 1:40 PM  Date/Time Reported: August 11, 2009 1:40 PM   Glucose (random): 289  mg/dL   (Normal Range: 16-109)

## 2010-04-06 NOTE — Progress Notes (Signed)
Summary: results  Phone Note Call from Patient   Summary of Call: would like to get results alc level [id number] Initial call taken by: Rudene Anda,  August 18, 2009 9:02 AM  Follow-up for Phone Call        Returned call.  Left msg on voicemail that her labs were high.  I'm trying to see what resources are available to help her with medication. Follow-up by: Esperanza Sheets PA,  August 18, 2009 9:19 AM

## 2010-04-06 NOTE — Progress Notes (Signed)
  Phone Note Call from Patient   Summary of Call: Michelle [name] came in stating she has been having problems with her eyes, with her vision being blurry. Per Dr. Lodema Hong she needs to see an opthlamologist about this problem. Will check sugar here in office today Initial call taken by: Everitt Amber,  November 17, 2008 1:55 PM  Follow-up for Phone Call        pt is also to be advised to go to the Ed for evaluation of her blurred vision if she is unabl;e to  see even to drive herself Follow-up by: Syliva Overman MD,  November 17, 2008 3:21 PM  Additional Follow-up for Phone Call Additional follow up Details #1::        Patient came back but left before we got a chance to get to her. She was out there no more than 5 minutes. Additional Follow-up by: Everitt Amber,  November 17, 2008 3:46 PM

## 2010-04-06 NOTE — Miscellaneous (Signed)
Summary: samples  Clinical Lists Changes  Medications: Added new medication of BENICAR 20 MG TABS (OLMESARTAN MEDOXOMIL) Take 1 tablet by mouth once a day - Signed Rx of BENICAR 20 MG TABS (OLMESARTAN MEDOXOMIL) Take 1 tablet by mouth once a day;  #14 x 0;  Signed;  Entered by: Everitt Amber;  Authorized by: Syliva Overman MD;  Method used: Samples Given    Prescriptions: BENICAR 20 MG TABS (OLMESARTAN MEDOXOMIL) Take 1 tablet by mouth once a day  #14 x 0   Entered by:   Everitt Amber   Authorized by:   Syliva Overman MD   Signed by:   Everitt Amber on 11/17/2008   Method used:   Samples Given   RxID:   [id number]

## 2010-04-06 NOTE — Miscellaneous (Signed)
  Clinical Lists Changes  Medications: Added new medication of LOVASTATIN 40 MG TABS (LOVASTATIN) Take 1 tab by mouth at bedtime - Signed Added new medication of GLIPIZIDE 10 MG XR24H-TAB (GLIPIZIDE) .one tablet every morning at breakfast - Signed Rx of LOVASTATIN 40 MG TABS (LOVASTATIN) Take 1 tab by mouth at bedtime;  #30 x 4;  Signed;  Entered by: Syliva Overman MD;  Authorized by: Syliva Overman MD;  Method used: Historical Rx of GLIPIZIDE 10 MG XR24H-TAB (GLIPIZIDE) .one tablet every morning at breakfast;  #30 x 3;  Signed;  Entered by: Syliva Overman MD;  Authorized by: Syliva Overman MD;  Method used: Historical    Prescriptions: GLIPIZIDE 10 MG XR24H-TAB (GLIPIZIDE) .one tablet every morning at breakfast  #30 x 3   Entered and Authorized by:   Syliva Overman MD   Signed by:   Syliva Overman MD on 12/16/2009   Method used:   Historical   RxID:   [id number] LOVASTATIN 40 MG TABS (LOVASTATIN) Take 1 tab by mouth at bedtime  #30 x 4   Entered and Authorized by:   Syliva Overman MD   Signed by:   Syliva Overman MD on 12/16/2009   Method used:   Historical   RxID:   [id number]

## 2010-04-06 NOTE — Letter (Signed)
Summary: rx assistance  rx assistance   Imported By: Lind Guest 02/12/2010 16:38:11  _____________________________________________________________________  External Attachment:    Type:   Image     Comment:   External Document

## 2010-04-06 NOTE — Assessment & Plan Note (Signed)
Summary: follow up - room 2   Vital Signs:  Patient profile:   50 year old female Menstrual status:  regular Height:      64 inches Weight:      267.50 pounds BMI:     46.08 O2 Sat:      99 % on Room air Pulse rate:   91 / minute Resp:     16 per minute BP sitting:   140 / 80  (left arm)  Vitals Entered By: Adella Hare LPN (June 05, 8117 11:10 AM) CC: follow up Is Patient Diabetic? Yes Did you bring your meter with you today? No Pain Assessment Patient in pain? no        CC:  follow up.  History of Present Illness: Pt is here today for check up.  States she was in the ER Tues. Woke up in the morning & couldn't move anything head to toe.  Had to call EMS to transport her to the ER.  Was not having any pain. All the tests were normal per pt.  But they gave her Magnesium & she seemed to improve.  When EMS checked BP 170 something over 100 something.  She has been taking Lisinopril for BP.  Was previously on Benicar but has not Ins & at a previous ER visit they prescribed Lisinopril for her.  She has been taking this daily x> 1mos. Pt states even with taking BP meds still has days of BP running high when she checks it at home.   Had some Lt chest pain yesterday & has been having pain across thoracic back x about 1 wk.    Current Medications (verified): 1)  Metformin Hcl 1000 Mg Tabs (Metformin Hcl) .... Take 1 Tablet By Mouth Two Times A Day 2)  Glipizide 5 Mg Tabs (Glipizide) .... Take 1 Tablet By Mouth Two Times A Day 3)  Lisinopril 20 Mg Tabs (Lisinopril) .... One Tab By Mouth Once Daily  Allergies (verified): No Known Drug Allergies  Past History:  Past medical history reviewed for relevance to current acute and chronic problems.  Past Medical History: Reviewed history from 09/28/2007 and no changes required.   HYPERLIPIDEMIA (ICD-272.4) OBESITY (ICD-278.00) DM (ICD-250.00) HYPERTENSION (ICD-401.9)  Review of Systems General:  Denies chills and fever. ENT:   Denies nasal congestion. CV:  Complains of chest pain or discomfort; denies palpitations and shortness of breath with exertion. Resp:  Denies cough and shortness of breath. GI:  Denies abdominal pain, nausea, and vomiting. MS:  Complains of mid back pain and muscle weakness; denies joint pain. Neuro:  Denies numbness, tingling, and tremors.  Physical Exam  General:  Well-developed,well-nourished,in no acute distress; alert,appropriate and cooperative throughout examination Head:  Normocephalic and atraumatic without obvious abnormalities. No apparent alopecia or balding. Ears:  External ear exam shows no significant lesions or deformities.  Otoscopic examination reveals clear canals, tympanic membranes are intact bilaterally without bulging, retraction, inflammation or discharge. Hearing is grossly normal bilaterally. Nose:  External nasal examination shows no deformity or inflammation. Nasal mucosa are pink and moist without lesions or exudates. Mouth:  Oral mucosa and oropharynx without lesions or exudates.  Neck:  No deformities, masses, or tenderness noted.no thyromegaly.   Chest Wall:  no deformities, no tenderness, and no mass.   Lungs:  Normal respiratory effort, chest expands symmetrically. Lungs are clear to auscultation, no crackles or wheezes. Heart:  Normal rate and regular rhythm. S1 and S2 normal without gallop, murmur, click, rub or  other extra sounds. Abdomen:  Bowel sounds positive,abdomen soft and non-tender without masses, organomegaly or hernias noted. Extremities:  No clubbing, cyanosis, edema, or deformity noted with normal full range of motion of all joints.  Nl strength in UE & LE. Neurologic:  alert & oriented X3, cranial nerves II-XII intact, strength normal in all extremities, and sensation intact to light touch.  DTR bicep & patellar bilat +1/4. Cervical Nodes:  No lymphadenopathy noted Psych:  Cognition and judgment appear intact. Alert and cooperative with normal  attention span and concentration. No apparent delusions, illusions, hallucinations   Impression & Recommendations:  Problem # 1:  HYPERTENSION (ICD-401.9) Assessment Improved  Pt is stil having high blood pressure despite reading here today. Pt to check BP at home 1-2 x /wk, bring readings to next appt & blood pressure monitor to check for accuracy.  The following medications were removed from the medication list:    Benicar 20 Mg Tabs (Olmesartan medoxomil) .Marland Kitchen... Take 1 tablet by mouth once a day Her updated medication list for this problem includes:    Lisinopril 20 Mg Tabs (Lisinopril) ..... One tab by mouth once daily    Microzide 12.5 Mg Caps (Hydrochlorothiazide) .Marland Kitchen... Take one caps every morning for blood pressure  BP today: 140/80 Prior BP: 142/90 (11/04/2008)  Labs Reviewed: K+: 4.4 (11/17/2008) Creat: : 0.64 (11/17/2008)   Chol: 210 (11/17/2008)   HDL: 52 (11/17/2008)   LDL: 129 (11/17/2008)   TG: 146 (11/17/2008)  Orders: EKG w/ Interpretation (93000)  Problem # 2:  CHEST PAIN, ATYPICAL (ICD-786.59) Assessment: New  Discussed with pt that this is probably due to the pain she is having in her upperback/ribs. If recurrs, or persists may need further evaluation. EKG neg.  Orders: EKG w/ Interpretation (93000)  Problem # 3:  WEAKNESS (ICD-780.79) Assessment: Improved Will obtain ER records. Uncertain of etiology of this episode, but has resolved.  Complete Medication List: 1)  Metformin Hcl 1000 Mg Tabs (Metformin hcl) .... Take 1 tablet by mouth two times a day 2)  Glipizide 5 Mg Tabs (Glipizide) .... Take 1 tablet by mouth two times a day 3)  Lisinopril 20 Mg Tabs (Lisinopril) .... One tab by mouth once daily 4)  Anafranil 50 Mg Caps (Clomipramine hcl) 5)  Microzide 12.5 Mg Caps (Hydrochlorothiazide) .... Take one caps every morning for blood pressure  Patient Instructions: 1)  Please schedule a follow-up appointment in 1 month. 2)  It is important that you  exercise regularly at least 20 minutes 5 times a week. If you develop chest pain, have severe difficulty breathing, or feel very tired , stop exercising immediately and seek medical attention. 3)  You need to lose weight. Consider a lower calorie diet and regular exercise.  4)  Continue taking Lisinopril once daily. 5)  I have added HCTZ once daily to help control your blood pressure. Prescriptions: MICROZIDE 12.5 MG CAPS (HYDROCHLOROTHIAZIDE) take one caps every morning for blood pressure  #30 x 0   Entered and Authorized by:   Esperanza Sheets PA   Signed by:   Esperanza Sheets PA on 06/05/2009   Method used:   Electronically to        [facility]  [address] (retail)       [address]       [place], [place]  [address]       Ph: [phone]       Fax: [phone]   RxID:  [id number]   Appended Document: follow up - room 2 Advise pt I reviewed her ER records. I would like her to have a Comp panel, Magnesium, & Vit B6 drawn before her next appt in 1 mos. Dx:  Abn/decreased liver enzymes, Magnesium deficiency  Appended Document: follow up - room 2 called patient, left message  Appended Document: follow up - room 2 called patient, no answer  Appended Document: follow up - room 2 patient aware

## 2010-04-06 NOTE — Assessment & Plan Note (Signed)
Summary: OV   Vital Signs:  Patient Profile:   50 Years Old Female Height:     64 inches Weight:      290 pounds BMI:     49.96 O2 Sat:      98 % Pulse rate:   80 / minute Resp:     16 per minute BP sitting:   122 / 86  (right arm)  Pt. in pain?   no  Vitals Entered By: Everitt Amber (December 31, 2007 11:16 AM)                  Chief Complaint:  Follow up and hoarsness.  History of Present Illness: Pt has had increased symptoms of nasal congestion and clear dainage in the last several weeks. She denies any recent fevr or chills.she denies depression or anxiety. She denies polyuria , polydypsia or hypoglycemic episode. She is currently unemplyed and is inschool.     Current Allergies: No known allergies      Review of Systems  General      Denies chills, fatigue, and fever.  ENT      Complains of hoarseness and nasal congestion.      Denies sinus pressure and sore throat.  CV      Denies chest pain or discomfort, palpitations, shortness of breath with exertion, and swelling of feet.  Resp      Denies cough, shortness of breath, sputum productive, and wheezing.  GI      Denies abdominal pain, constipation, diarrhea, nausea, and vomiting.  GU      Denies dysuria and urinary frequency.  MS      Complains of joint pain, low back pain, mid back pain, and stiffness.  Psych      Complains of anxiety.      Denies depression.  Endo      Denies cold intolerance, excessive hunger, excessive thirst, excessive urination, heat intolerance, polyuria, and weight change.  Allergy      Complains of itching eyes, seasonal allergies, and sneezing.   Physical Exam  General:     overweight-appearing.   Head:     Normocephalic and atraumatic without obvious abnormalities. No apparent alopecia or balding. Eyes:     vision grossly intact.   Ears:     External ear exam shows no significant lesions or deformities.  Otoscopic examination reveals clear canals,  tympanic membranes are intact bilaterally without bulging, retraction, inflammation or discharge. Hearing is grossly normal bilaterally. Nose:     no external erythema and no nasal discharge.   Mouth:     pharynx pink and moist and fair dentition.   Neck:     No deformities, masses, or tenderness noted. Lungs:     Normal respiratory effort, chest expands symmetrically. Lungs are clear to auscultation, no crackles or wheezes. Heart:     Normal rate and regular rhythm. S1 and S2 normal without gallop, murmur, click, rub or other extra sounds. Abdomen:     soft and non-tender.   Msk:     decreased ROM spine and knes Extremities:     no edema Neurologic:     alert & oriented X3, cranial nerves II-XII intact, strength normal in all extremities, and sensation intact to light touch.   Skin:     Intact without suspicious lesions or rashes Cervical Nodes:     No lymphadenopathy noted Psych:     Oriented X3, memory intact for recent and remote, and normally interactive.  Diabetes Management Exam:    Foot Exam (with socks and/or shoes not present):       Sensory-Pinprick/Light touch:          Left medial foot (L-4): normal          Left dorsal foot (L-5): normal          Left lateral foot (S-1): normal          Right medial foot (L-4): normal          Right dorsal foot (L-5): normal          Right lateral foot (S-1): normal       Inspection:          Left foot: normal          Right foot: normal       Nails:          Left foot: normal          Right foot: normal    Impression & Recommendations:  Problem # 1:  HYPERLIPIDEMIA (ICD-272.4) Assessment: Unchanged  Her updated medication list for this problem includes:    Lovastatin 20 Mg Tabs (Lovastatin) .Marland Kitchen... Take 1 tab by mouth at bedtime  Labs Reviewed: Chol: 203 (10/04/2007)   HDL: 50 (10/04/2007)   LDL: 133 (10/04/2007)   TG: 98 (10/04/2007) SGOT: 11 (10/04/2007)   SGPT: 9 (10/04/2007)   Problem # 2:  DM  (ICD-250.00) Assessment: Improved  The following medications were removed from the medication list:    Glipizide 2.5 Mg Xr24h-tab (Glipizide) .Marland Kitchen... Take three tablets by mouth twice daily  Her updated medication list for this problem includes:    Glumetza 1000 Mg Xr24h-tab (Metformin hcl) .Marland Kitchen... Take 1 tablet by mouth two times a day    Glipizide 2.5 Mg Xr24h-tab (Glipizide) ..... One tab by mouth qd    Metformin Hcl 1000 Mg Tabs (Metformin hcl) ..... One tab by mouth bid  Orders: Glucose, (CBG) (82962) Hemoglobin A1C (83036)  Labs Reviewed: HgBA1c: 7.3 (12/31/2007)   Creat: 0.69 (10/04/2007)      Problem # 3:  OBESITY (ICD-278.00) Assessment: Unchanged  Problem # 4:  HYPERTENSION (ICD-401.9) Assessment: Improved  Her updated medication list for this problem includes:    Azor 10-40 Mg Tabs (Amlodipine-olmesartan) .Marland Kitchen... Take 1 tablet by mouth once a day    Tekturna Hct 300-25 Mg Tabs (Aliskiren-hydrochlorothiazide) .Marland Kitchen... Take 1 tablet by mouth once a day    Clonidine Hcl 0.2 Mg Tabs (Clonidine hcl) .Marland Kitchen... Take 1 tablet by mouth two times a day  BP today: 122/86 Prior BP: 132/90 (10/04/2007)  Labs Reviewed: Creat: 0.69 (10/04/2007) Chol: 203 (10/04/2007)   HDL: 50 (10/04/2007)   LDL: 133 (10/04/2007)   TG: 98 (10/04/2007)   Complete Medication List: 1)  Azor 10-40 Mg Tabs (Amlodipine-olmesartan) .... Take 1 tablet by mouth once a day 2)  Tekturna Hct 300-25 Mg Tabs (Aliskiren-hydrochlorothiazide) .... Take 1 tablet by mouth once a day 3)  Glumetza 1000 Mg Xr24h-tab (Metformin hcl) .... Take 1 tablet by mouth two times a day 4)  Lovastatin 20 Mg Tabs (Lovastatin) .... Take 1 tab by mouth at bedtime 5)  Clonidine Hcl 0.2 Mg Tabs (Clonidine hcl) .... Take 1 tablet by mouth two times a day 6)  Singulair 10 Mg Tabs (Montelukast sodium) .... Take 1 tablet by mouth once a day 7)  Glipizide 2.5 Mg Xr24h-tab (Glipizide) .... One tab by mouth qd 8)  Metformin Hcl 1000 Mg Tabs  (Metformin hcl) .Marland KitchenMarland KitchenMarland Kitchen  One tab by mouth bid   Patient Instructions: 1)  Please schedule a follow-up appointment in 4 months. 2)  It is important that you exercise regularly at least 20 minutes 5 times a week. If you develop chest pain, have severe difficulty breathing, or feel very tired , stop exercising immediately and seek medical attention. 3)  You need to lose weight. Consider a lower calorie diet and regular exercise.    Prescriptions: METFORMIN HCL 1000 MG TABS (METFORMIN HCL) ONE TAB by mouth BID  #60 x 4   Entered by:   Worthy Keeler LPN   Authorized by:   Syliva Overman MD   Signed by:   Worthy Keeler LPN on 16/12/9602   Method used:   Handwritten   RxID:   [id number] GLIPIZIDE 2.5 MG XR24H-TAB (GLIPIZIDE) ONE TAB by mouth QD  #30 x 4   Entered by:   Worthy Keeler LPN   Authorized by:   Syliva Overman MD   Signed by:   Worthy Keeler LPN on 21/30/8657   Method used:   Handwritten   RxID:   [id number] LOVASTATIN 20 MG  TABS (LOVASTATIN) Take 1 tab by mouth at bedtime  #30 x 4   Entered by:   Worthy Keeler LPN   Authorized by:   Syliva Overman MD   Signed by:   Worthy Keeler LPN on 03/09/7251   Method used:   Samples Given   RxID:   [id number]  ] Laboratory Results   Blood Tests   Date/Time Received: December 31, 2007 11am Date/Time Reported: December 31, 2007 11am  Glucose (random): 133 mg/dL   (Normal Range: 75-643) HGBA1C: 7.3%   (Normal Range: Non-Diabetic - 3-6%   Control Diabetic - 6-8%)

## 2010-04-06 NOTE — Letter (Signed)
Summary: Letter  Letter   Imported By: Lind Guest 12/11/2009 15:23:46  _____________________________________________________________________  External Attachment:    Type:   Image     Comment:   External Document

## 2010-04-06 NOTE — Miscellaneous (Signed)
Summary: labs  Clinical Lists Changes  Orders: Added new Test order of T-Comprehensive Metabolic Panel [phone]) - Signed Added new Test order of T-Magnesium ([id number]) - Signed Added new Test order of T- * Misc. Laboratory test [phone]) - Signed

## 2010-04-06 NOTE — Letter (Signed)
Summary: rx assistance  rx assistance   Imported By: Lind Guest 10/15/2009 14:42:26  _____________________________________________________________________  External Attachment:    Type:   Image     Comment:   External Document

## 2010-04-06 NOTE — Progress Notes (Signed)
Summary: lantus  Phone Note Call from Patient   Summary of Call: needs rx sent over for her lantus at [facility] in [place] call her back at [phone] to let her know Initial call taken by: Lind Guest,  November 24, 2008 9:48 AM  Follow-up for Phone Call        not on her med list, does she take Lantus? Follow-up by: Everitt Amber,  November 25, 2008 8:42 AM  Additional Follow-up for Phone Call Additional follow up Details #1::        not from the last visit, no, pls find out what she has been doing re meds and what her bnlood sugars have been Additional Follow-up by: Syliva Overman MD,  November 25, 2008 11:04 AM    Additional Follow-up for Phone Call Additional follow up Details #2::    she was wanting lancets, not lantus  but she has already picked up a box so she said not to worry about it Follow-up by: Everitt Amber,  November 25, 2008 1:53 PM

## 2010-04-06 NOTE — Letter (Signed)
Summary: patient assistance program  patient assistance program   Imported By: Lind Guest 10/23/2009 15:00:50  _____________________________________________________________________  External Attachment:    Type:   Image     Comment:   External Document

## 2010-04-06 NOTE — Assessment & Plan Note (Signed)
   Vital Signs:  Patient Profile:   50 Years Old Female Height:     64 inches Weight:      275.03 pounds BMI:     47.38 Pulse rate:   80 / minute Resp:     16 per minute BP sitting:   124 / 76  (right arm)  Pt. in pain?   no  Vitals Entered By: Chipper Herb (Jul 10, 2007 8:37 AM)              CBG Result 268      Patient in for review of chronic medical problems. She reports dietary change with a resultant 11 lb. weight loss in the past 1 month. She denies polyuria, polydypsia, but does not test her sugars. Since her last visit, she had left knee arthroscopy and has done well. The patient denies intolerance of her blod pressure meds. She specifically denies light headedness, leg swelling or chest tightness.          Review of Systems  General      Denies chills, fatigue, fever, malaise, sleep disorder, sweats, and weakness.  ENT      Denies difficulty swallowing, ear discharge, hoarseness, nasal congestion, sinus pressure, and sore throat.  CV      Denies chest pain or discomfort, fatigue, lightheadness, palpitations, shortness of breath with exertion, and swelling of feet.  Resp      Denies cough, shortness of breath, sputum productive, and wheezing.  GI      Denies abdominal pain, diarrhea, indigestion, nausea, and vomiting.  GU      Denies dysuria and urinary frequency.  MS      Denies joint pain, joint redness, and joint swelling.  Endo      Denies excessive thirst, excessive urination, and polyuria.   Physical Exam  General:     overweight-appearing.   Head:     Normocephalic and atraumatic without obvious abnormalities. No apparent alopecia or balding. Eyes:     vision grossly intact.   Ears:     R ear normal and L ear normal.   Nose:     no external deformity and no nasal discharge.   Mouth:     Oral mucosa and oropharynx without lesions or exudates.  Teeth in good repair. Neck:     No deformities, masses, or tenderness noted. Chest  Wall:     No deformities, masses, or tenderness noted. Lungs:     Normal respiratory effort, chest expands symmetrically. Lungs are clear to auscultation, no crackles or wheezes. Heart:     Normal rate and regular rhythm. S1 and S2 normal without gallop, murmur, click, rub or other extra sounds. Abdomen:     Bowel sounds positive,abdomen soft and non-tender without masses, organomegaly or hernias noted. Extremities:     No clubbing, cyanosis, edema, or deformity noted with normal full range of motion of all joints.   Neurologic:     alert & oriented X3 and cranial nerves II-XII intact.        ] Laboratory Results   Blood Tests   Date/Time Received: 07/10/07 @ 830am Date/Time Reported: 07/10/07 @ 830am  HGBA1C: 11.1%   (Normal Range: Non-Diabetic - 3-6%   Control Diabetic - 6-8%) CBG Random:: 268    See paper chart.

## 2010-04-06 NOTE — Progress Notes (Signed)
  Phone Note Call from Patient   Summary of Call: patient went to er yesterday [facility], went for blood pressure being high, 232/114 put her on lisinopril 20mg  bp today 118/108 states she cant afford co pay to come in  [phone] Initial call taken by: Adella Hare LPN,  May 11, 2009 2:56 PM  Follow-up for Phone Call         it can take a couple weeks to get the full effect of the bp med.  will need an appt to follow up in about 2 wks.  continue med at the same dose til then. Follow-up by: Esperanza Sheets PA,  May 11, 2009 3:03 PM   Additional Follow-up for Phone Call Additional follow up Details #1::         patient aware Additional Follow-up by: Adella Hare LPN,  May 11, 2009 3:39 PM

## 2010-04-06 NOTE — Assessment & Plan Note (Signed)
Summary: office visit   Vital Signs:  Patient profile:   50 year old female Menstrual status:  regular Height:      64 inches Weight:      253 pounds BMI:     43.58 O2 Sat:      98 % Pulse rate:   85 / minute Pulse rhythm:   regular Resp:     16 per minute BP sitting:   142 / 90  Vitals Entered By: Everitt Amber (November 04, 2008 10:53 AM)  Nutrition Counseling: Patient's BMI is greater than 25 and therefore counseled on weight management options. CC: Follow up chronic problems Is Patient Diabetic? Yes  Pain Assessment Patient in pain? no       years   days  Menstrual Status regular Last PAP Result Normal   CC:  Follow up chronic problems.  History of Present Illness: Pt reports fatigue, bluirred vision, polyuria and polydypsia. he has not been testing her blood sugars and has not been taking medication for her sugar . she has lost a significant amt of weight since her last visit, unfortunately because of elevbated blood sugar, and poor health. She has alot of stress and depression. She is withoutan income and is unable to get help from the system for her healthcare. She denies any recent fever or chills or symptoms of infection.. she doeshave intermittent joint pain.   Current Medications (verified): 1)  Azor 10-40 Mg  Tabs (Amlodipine-Olmesartan) .... Take 1 Tablet By Mouth Once A Day 2)  Tekturna Hct 300-25 Mg  Tabs (Aliskiren-Hydrochlorothiazide) .... Take 1 Tablet By Mouth Once A Day 3)  Glumetza 1000 Mg  Xr24h-Tab (Metformin Hcl) .... Take 1 Tablet By Mouth Two Times A Day 4)  Lovastatin 20 Mg  Tabs (Lovastatin) .... Take 1 Tab By Mouth At Bedtime 5)  Clonidine Hcl 0.2 Mg  Tabs (Clonidine Hcl) .... Take 1 Tablet By Mouth Two Times A Day 6)  Singulair 10 Mg  Tabs (Montelukast Sodium) .... Take 1 Tablet By Mouth Once A Day 7)  Glipizide 2.5 Mg Xr24h-Tab (Glipizide) .... One Tab By Mouth Qd 8)  Metformin Hcl 1000 Mg Tabs (Metformin Hcl) .... One Tab By Mouth  Bid  Allergies (verified): No Known Drug Allergies  Review of Systems General:  Complains of fatigue and weight loss; denies chills and fever. Eyes:  See HPI; Complains of blurring. ENT:  Denies earache, hoarseness, nasal congestion, sinus pressure, and sore throat. CV:  Denies chest pain or discomfort, difficulty breathing while lying down, palpitations, and swelling of feet. Resp:  Complains of chest discomfort; denies cough and shortness of breath. GI:  Denies abdominal pain, change in bowel habits, constipation, diarrhea, nausea, and vomiting. GU:  Denies dysuria. MS:  See HPI. Derm:  Denies itching, lesion(s), and rash. Neuro:  Denies headaches, poor balance, seizures, and sensation of room spinning. Psych:  See HPI; Denies suicidal thoughts/plans, thoughts of violence, and unusual visions or sounds. Endo:  See HPI; Complains of excessive thirst, excessive urination, and weight change. Heme:  Denies abnormal bruising and bleeding. Allergy:  Complains of seasonal allergies.  Physical Exam  General:  alert, well-hydrated, and overweight-appearing. HEENT: No facial asymmetry,  EOMI, No sinus tenderness, TM's Clear, oropharynx  pink and moist.   Chest: Clear to auscultation bilaterally.  CVS: S1, S2, No murmurs, No S3.   Abd: Soft, Nontender.  MS: Adequate ROM spine, hips, shoulders and knees.  Ext: No edema.   CNS: CN 2-12 intact, power tone  and sensation normal throughout.   Skin: Intact, no visible lesions or rashes.  Psych: Good eye contact, normal affect.  Memory intact, not anxious or depressed appearing.      Impression & Recommendations:  Problem # 1:  HYPERLIPIDEMIA (ICD-272.4) Assessment Comment Only  The following medications were removed from the medication list:    Lovastatin 20 Mg Tabs (Lovastatin) .Marland Kitchen... Take 1 tab by mouth at bedtime  Orders: T-Hepatic Function [id number]) T-Lipid Profile [id number])  Labs Reviewed: SGOT: 11 (10/04/2007)   SGPT: 9  (10/04/2007), pt encouraged to obtain lab data asap as it is past due   HDL:50 (10/04/2007), 44 (06/05/2007)  LDL:133 (10/04/2007), 141 (06/05/2007)  Chol:203 (10/04/2007), 206 (06/05/2007)  Trig:98 (10/04/2007), 104 (06/05/2007)  Problem # 2:  OBESITY (ICD-278.00) Assessment: Improved  Ht: 64 (11/04/2008)   Wt: 253 (11/04/2008)   BMI: 43.58 (11/04/2008)  Problem # 3:  HYPERTENSION (ICD-401.9) Assessment: Unchanged  The following medications were removed from the medication list:    Azor 10-40 Mg Tabs (Amlodipine-olmesartan) .Marland Kitchen... Take 1 tablet by mouth once a day    Tekturna Hct 300-25 Mg Tabs (Aliskiren-hydrochlorothiazide) .Marland Kitchen... Take 1 tablet by mouth once a day    Clonidine Hcl 0.2 Mg Tabs (Clonidine hcl) .Marland Kitchen... Take 1 tablet by mouth two times a day  Orders: T-Basic Metabolic Panel [id number])  BP today: 142/90, samples of benicar 20mg  daily requested Prior BP: 150/90 (04/02/2008)  Labs Reviewed: K+: 4.2 (10/04/2007) Creat: : 0.69 (10/04/2007)   Chol: 203 (10/04/2007)   HDL: 50 (10/04/2007)   LDL: 133 (10/04/2007)   TG: 98 (10/04/2007)  Problem # 4:  DM (ICD-250.00) Assessment: Deteriorated  The following medications were removed from the medication list:    Azor 10-40 Mg Tabs (Amlodipine-olmesartan) .Marland Kitchen... Take 1 tablet by mouth once a day    Glumetza 1000 Mg Xr24h-tab (Metformin hcl) .Marland Kitchen... Take 1 tablet by mouth two times a day    Glipizide 2.5 Mg Xr24h-tab (Glipizide) ..... One tab by mouth qd    Metformin Hcl 1000 Mg Tabs (Metformin hcl) ..... One tab by mouth bid Her updated medication list for this problem includes:    Metformin Hcl 1000 Mg Tabs (Metformin hcl) .Marland Kitchen... Take 1 tablet by mouth two times a day    Glipizide 5 Mg Tabs (Glipizide) .Marland Kitchen... Take 1 tablet by mouth two times a day, the dose will likely be increased to 10mg  twice daily, pt to call back with fasting sugars, attempts are bein made to obtain testing upplies for her also.  Orders: Glucose, (CBG)  ([id number]) Hemoglobin A1C (83036)  Labs Reviewed: Creat: 0.69 (10/04/2007)    Reviewed HgBA1c results: >14 (11/04/2008)  9.3 (04/03/2008)  Complete Medication List: 1)  Metformin Hcl 1000 Mg Tabs (Metformin hcl) .... Take 1 tablet by mouth two times a day 2)  Glipizide 5 Mg Tabs (Glipizide) .... Take 1 tablet by mouth two times a day  Patient Instructions: 1)  Please schedule a follow-up appointment in 4 months. 2)  It is important that you exercise regularly at least 20 minutes 5 times a week. If you develop chest pain, have severe difficulty breathing, or feel very tired , stop exercising immediately and seek medical attention. 3)  You need to lose weight. Consider a lower calorie diet and regular exercise. 4)  Your blood sugar is too high, meds will be started. 5)  BMP prior to visit, ICD-9: 6)  Hepatic Panel prior to visit, ICD-9:  fasting asap 7)  Lipid Panel prior to visit,  ICD-9 8)  I will try asnd obtain med fro your cholesterol and Bp in the next 2 weeks Prescriptions: GLIPIZIDE 5 MG TABS (GLIPIZIDE) Take 1 tablet by mouth two times a day  #180 x 1   Entered and Authorized by:   Syliva Overman MD   Signed by:   Syliva Overman MD on 11/04/2008   Method used:   Print then Give to Patient   RxID:   [id number] METFORMIN HCL 1000 MG TABS (METFORMIN HCL) Take 1 tablet by mouth two times a day  #180 x 1   Entered and Authorized by:   Syliva Overman MD   Signed by:   Syliva Overman MD on 11/04/2008   Method used:   Print then Give to Patient   RxID:   [id number]   Laboratory Results   Blood Tests   Date/Time Received: November 04, 2008  Date/Time Reported: November 04, 2008   Glucose (random): 265 mg/dL   (Normal Range: 84-696) HGBA1C: >14%   (Normal Range: Non-Diabetic - 3-6%   Control Diabetic - 6-8%)      Appended Document: office visit pls contact pt and find out wht her blood sugars are running, fasting and bedtime and let me know pls , the glipizide  dose will likely be increased. This is impt. Also did you contact [facility] to obtain testing supplies for her?  We have benicar 20mg  , and crestor is coming for her, thanks, pLS I NEED feedback on this  Appended Document: office visit Returned call, left message  Appended Document: office visit FASTING  today was 162 day before yesterday-195 day before that- 185. Not at home but will call back with more results later   Appended Document: office visit called for testing supplies. will be here thursday  Appended Document: office visit thanks advisre her to take glipizide 5mg  TWO twice daily , the equivalent of 10mg  twice daily, I believe she needs that dose   Appended Document: office visit patient aware

## 2010-04-06 NOTE — Assessment & Plan Note (Signed)
Summary: follow up- room 1   Vital Signs:  Patient profile:   50 year old female Menstrual status:  regular Height:      64 inches Weight:      265.25 pounds BMI:     45.69 O2 Sat:      98 % on Room air Pulse rate:   81 / minute Resp:     16 per minute BP sitting:   144 / 90  (left arm)  Vitals Entered By: Adella Hare LPN (November 05, 2009 1:20 PM)  Nutrition Counseling: Patient's BMI is greater than 25 and therefore counseled on weight management options. CC: follow-up visit Is Patient Diabetic? Yes Did you bring your meter with you today? No Comments did not bring meds to ov   CC:  follow-up visit.  History of Present Illness: Pt presents to day for follow up. She started Levamir insulin.  She is up to 26 units at bedtime.  Has been titrating up as directed.  Unable to check blood sugar this morning due to error on machine, yesterday 202, and day before 157.  she is consistent in her diet.  Not exercising. No episodes of hypoglycemia. Is taking Metformin , but sometimes once daily and sometimes two times a day. Pneumovax utd. Not taking ASA.  Hx of htn. Taking medications as prescribed. No headache, chest pain or palpitations.  Blood pressure at home running 170's over 90's.  Pt has applied for the [facility] discount.  Is due for her mamm, pap and f/u diabetic labs.      Allergies: No Known Drug Allergies  Past History:  Past medical history reviewed for relevance to current acute and chronic problems.  Past Medical History: Reviewed history from 09/28/2007 and no changes required.   HYPERLIPIDEMIA (ICD-272.4) OBESITY (ICD-278.00) DM (ICD-250.00) HYPERTENSION (ICD-401.9)  Review of Systems General:  Denies chills, fever, and loss of appetite. ENT:  Denies ear discharge and nasal congestion. CV:  Denies chest pain or discomfort and palpitations. Resp:  Denies cough and shortness of breath. GI:  Denies abdominal pain, indigestion, nausea, and  vomiting.  Physical Exam  General:  Well-developed,well-nourished,in no acute distress; alert,appropriate and cooperative throughout examination Head:  Normocephalic and atraumatic without obvious abnormalities. No apparent alopecia or balding. Ears:  External ear exam shows no significant lesions or deformities.  Otoscopic examination reveals clear canals, tympanic membranes are intact bilaterally without bulging, retraction, inflammation or discharge. Hearing is grossly normal bilaterally. Nose:  External nasal examination shows no deformity or inflammation. Nasal mucosa are pink and moist without lesions or exudates. Mouth:  Oral mucosa and oropharynx without lesions or exudates.  Teeth in good repair. Neck:  No deformities, masses, or tenderness noted. Lungs:  Normal respiratory effort, chest expands symmetrically. Lungs are clear to auscultation, no crackles or wheezes. Heart:  Normal rate and regular rhythm. S1 and S2 normal without gallop, murmur, click, rub or other extra sounds. Skin:  Intact without suspicious lesions or rashes Cervical Nodes:  No lymphadenopathy noted Psych:  Cognition and judgment appear intact. Alert and cooperative with normal attention span and concentration. No apparent delusions, illusions, hallucinations  Diabetes Management Exam:    Foot Exam (with socks and/or shoes not present):       Sensory-Monofilament:          Left foot: diminished          Right foot: diminished       Inspection:          Left  foot: normal          Right foot: normal       Nails:          Left foot: normal          Right foot: normal   Impression & Recommendations:  Problem # 1:  DM (ICD-250.00) Assessment Unchanged  The following medications were removed from the medication list:    Glipizide 5 Mg Tabs (Glipizide) .Marland Kitchen... Take 1 tablet by mouth two times a day Her updated medication list for this problem includes:    Metformin Hcl 1000 Mg Tabs (Metformin hcl) .Marland Kitchen... Take  1 tablet by mouth two times a day    Lisinopril-hydrochlorothiazide 20-12.5 Mg Tabs (Lisinopril-hydrochlorothiazide) .Marland Kitchen... Take 2 every morning for high blood pressure    Levemir Flexpen 100 Unit/ml Soln (Insulin detemir) .Marland KitchenMarland KitchenMarland KitchenMarland Kitchen 10 units subcutaneously at bedtime  Problem # 2:  HYPERTENSION (ICD-401.9) Assessment: Improved  Her updated medication list for this problem includes:    Lisinopril-hydrochlorothiazide 20-12.5 Mg Tabs (Lisinopril-hydrochlorothiazide) .Marland Kitchen... Take 2 every morning for high blood pressure    Clonidine Hcl 0.1 Mg Tabs (Clonidine hcl) .Marland Kitchen... Take 1 two times a day for blood pressure  BP today: 144/90 Prior BP: 170/100 (08/11/2009)  Labs Reviewed: K+: 4.5 (07/10/2009) Creat: : 0.56 (07/10/2009)   Chol: 210 (11/17/2008)   HDL: 52 (11/17/2008)   LDL: 129 (11/17/2008)   TG: 146 (11/17/2008)  Problem # 3:  HYPERLIPIDEMIA (ICD-272.4) Assessment: Comment Only Pt overdue for labs.  Has applied for [facility] discount.  Will f/u at appt in 1 mos to see if has been approved.  Labs Reviewed: SGOT: 12 (07/10/2009)   SGPT: 10 (07/10/2009)   HDL:52 (11/17/2008), 50 (10/04/2007)  LDL:129 (11/17/2008), 133 (10/04/2007)  Chol:210 (11/17/2008), 203 (10/04/2007)  Trig:146 (11/17/2008), 98 (10/04/2007)  Complete Medication List: 1)  Metformin Hcl 1000 Mg Tabs (Metformin hcl) .... Take 1 tablet by mouth two times a day 2)  Lisinopril-hydrochlorothiazide 20-12.5 Mg Tabs (Lisinopril-hydrochlorothiazide) .... Take 2 every morning for high blood pressure 3)  Nystatin-triamcinolone 100000-0.1 Unit/gm-% Crea (Nystatin-triamcinolone) .... Apply to affected area two times a day as needed 4)  Levemir Flexpen 100 Unit/ml Soln (Insulin detemir) .Marland Kitchen.. 10 units subcutaneously at bedtime 5)  Clonidine Hcl 0.1 Mg Tabs (Clonidine hcl) .... Take 1 two times a day for blood pressure  Other Orders: Influenza Vaccine NON MCR ([address])  Patient Instructions: 1)  Please schedule a follow-up appointment in 1  month. 2)  Continue titrating your Levamir as directed. 3)  I recommend you take your Metformin twice a day every day.  This will help with your blood sugars. 4)  Continue checking your blood sugar regularly. 5)  It is important that you exercise regularly at least 20 minutes 5 times a week. If you develop chest pain, have severe difficulty breathing, or feel very tired , stop exercising immediately and seek medical attention. 6)  You need to lose weight. Consider a lower calorie diet and regular exercise.  7)  Take an Aspirin every day. 8)  See your eye doctor yearly to check for diabetic eye damage. 9)  Check your feet each night for sore areas, calluses or signs of infection. 10)  You are due for diabetic blood work in Sept.  We will wait and see if your [facility] discount gets approved and discuss at your next appt. 11)  You have received a flu shot today. Prescriptions: CLONIDINE HCL 0.1 MG TABS (CLONIDINE HCL) take 1 two times a day for  blood pressure  #60 x 3   Entered and Authorized by:   Esperanza Sheets PA   Signed by:   Esperanza Sheets PA on 11/05/2009   Method used:   Electronically to        [facility] (retail)       [address]       [place], [place]  [address]       Ph: [phone]       Fax: [phone]   RxID:   [id number]    Immunizations Administered:  Influenza Vaccine # 1:    Vaccine Type: Fluvax Non-MCR    Site: left deltoid    Mfr: NOVARTIS    Dose: 0.5 ml    Route: IM    Given by: Adella Hare LPN    Exp. Date: 05/12    Lot #: [id number]    VIS given: 09/29/09 version given November 05, 2009.

## 2010-04-06 NOTE — Progress Notes (Signed)
Summary: nurse call  Phone Note Call from Patient   Summary of Call: pt coming tues to get blood work drawn. so would like to get samples. also thinks meds are making blurry eye vision. [phone] Initial call taken by: Rudene Anda,  November 13, 2008 9:06 AM  Follow-up for Phone Call        returned call, no answer Follow-up by: Worthy Keeler LPN,  November 13, 2008 2:57 PM  Additional Follow-up for Phone Call Additional follow up Details #1::        returned call, left message Additional Follow-up by: Worthy Keeler LPN,  November 14, 2008 9:03 AM    Additional Follow-up for Phone Call Additional follow up Details #2::    wanted to know if diabetic meds can cause blurred vision? states sugars been running 182, 225, 162 Follow-up by: Worthy Keeler LPN,  November 14, 2008 9:23 AM  Additional Follow-up for Phone Call Additional follow up Details #3:: Details for Additional Follow-up Action Taken: pls have as many samples ready for her as we have received, also let he know the blurry vision is likely because her blood sugars have been too high Additional Follow-up by: Syliva Overman MD,  November 14, 2008 12:01 PM  cannot get patient. Will let her know that when she comes in tuesday to get samples

## 2010-04-06 NOTE — Miscellaneous (Signed)
Summary: SAMPLES  Clinical Lists Changes  Medications: Changed medication from TEKTURNA HCT 300-25 MG  TABS (ALISKIREN-HYDROCHLOROTHIAZIDE) Take 1 tablet by mouth once a day to TEKTURNA HCT 300-25 MG  TABS (ALISKIREN-HYDROCHLOROTHIAZIDE) Take 1 tablet by mouth once a day - Signed Rx of TEKTURNA HCT 300-25 MG  TABS (ALISKIREN-HYDROCHLOROTHIAZIDE) Take 1 tablet by mouth once a day;  #84 x 0;  Signed;  Entered by: Worthy Keeler LPN;  Authorized by: Syliva Overman MD;  Method used: Samples Given    Prescriptions: TEKTURNA HCT 300-25 MG  TABS (ALISKIREN-HYDROCHLOROTHIAZIDE) Take 1 tablet by mouth once a day  #84 x 0   Entered by:   Worthy Keeler LPN   Authorized by:   Syliva Overman MD   Signed by:   Worthy Keeler LPN on 16/12/9602   Method used:   Samples Given   RxID:   [id number]

## 2010-04-06 NOTE — Assessment & Plan Note (Signed)
Summary: office visit   Vital Signs:  Patient Profile:   50 Years Old Female Height:     64 inches (162.56 cm) Weight:      277.56 pounds (126.16 kg) BMI:     47.82 Pulse rate:   106 / minute Resp:     16 per minute BP sitting:   150 / 90  (left arm)  Pt. in pain?   yes    Location:   legs6    Intensity:   6    Type:       dull  Vitals Entered By: Worthy Keeler LPN (April 02, 2008 1:40 PM)                  Chief Complaint:  follow up chronic problems.  History of Present Illness: left leg pain in the back of the calf x 4days.  Seen at [facility] ED 2 days ago for fatigue and a call was made from the physiscian there about pt. being sen twice over the past week.   Pt. reports getting very tired easily and also c/o headaches . She had a CT scan this past weekend and.the opthalmologist evaluated her in the Ed also.  She states she is unableto afford even $4 meds, shehas no telephone, or cable and is in the process of moving to a smaller home, financially she is strestched.    Current Allergies: No known allergies      Review of Systems  General      Complains of fatigue.  ENT      Denies earache, hoarseness, nasal congestion, sinus pressure, and sore throat.  Resp      Denies cough, sputum productive, and wheezing.  GI      Denies abdominal pain, constipation, diarrhea, nausea, and vomiting.  GU      Denies dysuria and urinary frequency.  MS      Complains of joint pain, cramps, and stiffness.  Psych      Complains of anxiety.      Denies depression, suicidal thoughts/plans, thoughts of violence, and unusual visions or sounds.  Endo      Complains of excessive thirst and excessive urination.  Heme      Denies bleeding, fevers, and skin discoloration.  Allergy      Complains of seasonal allergies.   Physical Exam  General:     HEENT: No facial asymmetry,  EOMI, No sinus tenderness, TM's Clear, oropharynx  pink and moist.   Chest: Clear to  auscultation bilaterally.  CVS: S1, S2, No murmurs, No S3.   Abd: Soft, Nontender.  MS: decreased ROM spine, hips, shoulders and knees. tender behind left calf, with mild swelling, homans negative Ext: No edema.   CNS: CN 2-12 intact, power tone and sensation normal throughout.   Skin: Intact, no visible lesions or rashes.  Psych: Good eye contact, normal effect.  Memory intact, not anxious or depressed appearing.     Impression & Recommendations:  Problem # 1:  LEG PAIN, LEFT (ICD-729.5) Assessment: Comment Only  Orders: Radiology Referral (Radiology)   Problem # 2:  HYPERLIPIDEMIA (ICD-272.4) Assessment: Comment Only  Her updated medication list for this problem includes:    Lovastatin 20 Mg Tabs (Lovastatin) .Marland Kitchen... Take 1 tab by mouth at bedtime  Labs Reviewed: Chol: 203 (10/04/2007)   HDL: 50 (10/04/2007)   LDL: 133 (10/04/2007)   TG: 98 (10/04/2007) SGOT: 11 (10/04/2007)   SGPT: 9 (10/04/2007)   Problem # 3:  OBESITY (ICD-278.00)  Assessment: Improved  Problem # 4:  DM (ICD-250.00) Assessment: Comment Only  Her updated medication list for this problem includes:    Glumetza 1000 Mg Xr24h-tab (Metformin hcl) .Marland Kitchen... Take 1 tablet by mouth two times a day    Glipizide 2.5 Mg Xr24h-tab (Glipizide) ..... One tab by mouth qd    Metformin Hcl 1000 Mg Tabs (Metformin hcl) ..... One tab by mouth bid  Orders: Glucose, (CBG) (82962) Hemoglobin A1C (83036)  Labs Reviewed: HgBA1c: 7.3 (12/31/2007)   Creat: 0.69 (10/04/2007)      Complete Medication List: 1)  Azor 10-40 Mg Tabs (Amlodipine-olmesartan) .... Take 1 tablet by mouth once a day 2)  Tekturna Hct 300-25 Mg Tabs (Aliskiren-hydrochlorothiazide) .... Take 1 tablet by mouth once a day 3)  Glumetza 1000 Mg Xr24h-tab (Metformin hcl) .... Take 1 tablet by mouth two times a day 4)  Lovastatin 20 Mg Tabs (Lovastatin) .... Take 1 tab by mouth at bedtime 5)  Clonidine Hcl 0.2 Mg Tabs (Clonidine hcl) .... Take 1 tablet by  mouth two times a day 6)  Singulair 10 Mg Tabs (Montelukast sodium) .... Take 1 tablet by mouth once a day 7)  Glipizide 2.5 Mg Xr24h-tab (Glipizide) .... One tab by mouth qd 8)  Metformin Hcl 1000 Mg Tabs (Metformin hcl) .... One tab by mouth bid   Patient Instructions: 1)  Please schedule a follow-up appointment in 3 months. 2)  It is important that you exercise regularly at least 20 minutes 5 times a week. If you develop chest pain, have severe difficulty breathing, or feel very tired , stop exercising immediately and seek medical attention. 3)  You need to lose weight. Consider a lower calorie diet and regular exercise.  4)  blood sugar is uncontrolled, please take your meds as directed an follow a low carb diet. 5)  You are being sent over for a test to ensure you have no clot in your leg   Prescriptions: AZOR 10-40 MG  TABS (AMLODIPINE-OLMESARTAN) Take 1 tablet by mouth once a day  #56 x 0   Entered by:   Everitt Amber   Authorized by:   Syliva Overman MD   Signed by:   Syliva Overman MD on 04/03/2008   Method used:   Print then Give to Patient   RxID:   [id number] TEKTURNA HCT 300-25 MG  TABS (ALISKIREN-HYDROCHLOROTHIAZIDE) Take 1 tablet by mouth once a day  #56 x 0   Entered by:   Everitt Amber   Authorized by:   Syliva Overman MD   Signed by:   Syliva Overman MD on 04/03/2008   Method used:   Print then Give to Patient   RxID:   [id number]    Orders Added: 1)  Glucose, (CBG) [82962] 2)  Hemoglobin A1C [83036] 3)  Est. Patient Level IV [95284] 4)  Radiology Referral [Radiology]   Laboratory Results   Blood Tests   Date/Time Received: 04/02/08 1:41p Date/Time Reported: 04/02/08 1:41p  Glucose (random): 160 mg/dL   (Normal Range: 13-244)      Appended Document: office visit pls enter the hBA1C result  Appended Document: office visit        Current Allergies: No known allergies         Complete Medication List: 1)  Azor 10-40 Mg  Tabs (Amlodipine-olmesartan) .... Take 1 tablet by mouth once a day 2)  Tekturna Hct 300-25 Mg Tabs (Aliskiren-hydrochlorothiazide) .... Take 1 tablet by mouth once a day 3)  Glumetza 1000 Mg Xr24h-tab (  Metformin hcl) .... Take 1 tablet by mouth two times a day 4)  Lovastatin 20 Mg Tabs (Lovastatin) .... Take 1 tab by mouth at bedtime 5)  Clonidine Hcl 0.2 Mg Tabs (Clonidine hcl) .... Take 1 tablet by mouth two times a day 6)  Singulair 10 Mg Tabs (Montelukast sodium) .... Take 1 tablet by mouth once a day 7)  Glipizide 2.5 Mg Xr24h-tab (Glipizide) .... One tab by mouth qd 8)  Metformin Hcl 1000 Mg Tabs (Metformin hcl) .... One tab by mouth bid      Laboratory Results   Blood Tests     HGBA1C: 9.3%   (Normal Range: Non-Diabetic - 3-6%   Control Diabetic - 6-8%)

## 2010-04-06 NOTE — Miscellaneous (Signed)
Summary: Orders Update  Clinical Lists Changes  Orders: Added new Test order of T- Hemoglobin A1C (83036-23375) - Signed 

## 2010-04-06 NOTE — Assessment & Plan Note (Signed)
Summary: F UP   Vital Signs:  Patient profile:   50 year old female Menstrual status:  regular Height:      64 inches Weight:      267.25 pounds BMI:     46.04 O2 Sat:      97 % Pulse rate:   82 / minute Pulse rhythm:   regular Resp:     16 per minute BP sitting:   150 / 86  (left arm) Cuff size:   xl  Vitals Entered By: Everitt Amber LPN (December 07, 2009 1:11 PM)  Nutrition Counseling: Patient's BMI is greater than 25 and therefore counseled on weight management options. CC: Follow up chronic problems   Vision Screening:Left eye with correction: 20 / 25 Right eye with correction: 20 / 25 Both eyes with correction: 20 / 20  Color vision testing: normal      Vision Entered By: Everitt Amber LPN (December 07, 2009 3:29 PM)   CC:  Follow up chronic problems .  History of Present Illness: Reports  that she has been doing fairly well. hee is still in school, and unemployed, though hopes this will soon change. Denies recent fever or chills. Denies sinus pressure, nasal congestion , ear pain or sore throat. Denies chest congestion, or cough productive of sputum. Denies chest pain, palpitations, PND, orthopnea or leg swelling. Denies abdominal pain, nausea, vomitting, diarrhea or constipation. Denies change in bowel movements or bloody stool. Denies dysuria , frequency, incontinence or hesitancy. Reports low back pain radiating to buttocks and left lower extremity, progressing in the past 1 yr with left foot numbness on the lateral aspect Denies headaches, vertigo, seizures. Denies depression, anxiety or insomnia. Denies  rash, lesions, or itch.     Current Medications (verified): 1)  Metformin Hcl 1000 Mg Tabs (Metformin Hcl) .... Take 1 Tablet By Mouth Two Times A Day 2)  Lisinopril-Hydrochlorothiazide 20-12.5 Mg Tabs (Lisinopril-Hydrochlorothiazide) .... Take 2 Every Morning For High Blood Pressure 3)  Nystatin-Triamcinolone 100000-0.1 Unit/gm-% Crea  (Nystatin-Triamcinolone) .... Apply To Affected Area Two Times A Day As Needed 4)  Levemir Flexpen 100 Unit/ml Soln (Insulin Detemir) .... 32 Units Subcutaneously At Bedtime  Allergies (verified): No Known Drug Allergies  Review of Systems      See HPI General:  Complains of fatigue. Eyes:  Denies discharge, eye pain, and red eye. MS:  staes in march/april tis yr she woke one morning unable to move totally, she was ubnble to move her  legs or arms, eval at [facility] and sent home, also c/pprogressive back pain with radiculopathy x 1 yr. Neuro:  Complains of numbness; left foot numbness. Endo:  Denies excessive thirst and excessive urination; not testing blood sugars, having difficulty affording supplies. Heme:  Denies abnormal bruising and bleeding. Allergy:  Complains of seasonal allergies.  Physical Exam  General:  Well-developed,obese,in no acute distress; alert,appropriate and cooperative throughout examination Head:  Normocephalic and atraumatic without obvious abnormalities. No apparent alopecia or balding. Eyes:  No corneal or conjunctival inflammation noted. EOMI. Perrla. Funduscopic exam benign, without hemorrhages, exudates or papilledema. Vision grossly normal. Ears:  External ear exam shows no significant lesions or deformities.  Otoscopic examination reveals clear canals, tympanic membranes are intact bilaterally without bulging, retraction, inflammation or discharge. Hearing is grossly normal bilaterally. Nose:  External nasal examination shows no deformity or inflammation. Nasal mucosa are pink and moist without lesions or exudates. Mouth:  pharynx pink and moist and fair dentition.   Neck:  No deformities, masses, or tenderness noted. Chest Wall:  No deformities, masses, or tenderness noted. Breasts:  No mass, nodules, thickening, tenderness, bulging, retraction, inflamation, nipple discharge or skin changes noted.   Lungs:  Normal respiratory effort, chest  expands symmetrically. Lungs are clear to auscultation, no crackles or wheezes. Heart:  Normal rate and regular rhythm. S1 and S2 normal without gallop, murmur, click, rub or other extra sounds. Abdomen:  Bowel sounds positive,abdomen soft and  obese,non-tender without masses, organomegaly or hernias noted. Rectal:  No external abnormalities noted. Normal sphincter tone. No rectal masses or tenderness. Genitalia:  Normal introitus for age, no external lesions, no vaginal discharge, mucosa pink and moist, no vaginal or cervical lesions, no vaginal atrophy, no friaility or hemorrhage, normal uterus size and position, no adnexal masses or tenderness Msk:  No deformity or scoliosis noted of thoracic or lumbar spine.   Pulses:  R and L carotid,radial,femoral,dorsalis pedis and posterior tibial pulses are full and equal bilaterally Extremities:  decreased rOM thoracolumbar spine, adequate in spine, hips and knees Neurologic:  No cranial nerve deficits noted. Station and gait are normal. Plantar reflexes are down-going bilaterally. DTRs are symmetrical throughout.  motor and coordinative functions appear intact.Rediuced sensation lateral aspect of left foot  Diabetes Management Exam:    Foot Exam (with socks and/or shoes not present):       Sensory-Monofilament:          Left foot: diminished          Right foot: normal       Inspection:          Left foot: normal          Right foot: normal       Nails:          Left foot: thickened          Right foot: normal   Impression & Recommendations:  Problem # 1:  SPECIAL SCREENING FOR MALIGNANT NEOPLASMS COLON (ICD-V76.51) Assessment Comment Only  Orders: Hemoccult Guaiac-1 spec.(in office) (82270)  Problem # 2:  SCREENING FOR MALIGNANT NEOPLASM OF THE CERVIX (ICD-V76.2) Assessment: Comment Only  Orders: Pap Smear (16109)  Problem # 3:  BACK PAIN WITH RADICULOPATHY (ICD-729.2) Assessment: Deteriorated  Orders: Radiology Referral  (Radiology)  Problem # 4:  HYPERTENSION (ICD-401.9) Assessment: Unchanged  The following medications were removed from the medication list:    Clonidine Hcl 0.1 Mg Tabs (Clonidine hcl) .Marland Kitchen... Take 1 two times a day for blood pressure Her updated medication list for this problem includes:    Lisinopril-hydrochlorothiazide 20-12.5 Mg Tabs (Lisinopril-hydrochlorothiazide) .Marland Kitchen... Take 2 every morning for high blood pressure  Orders: T-Basic Metabolic Panel [id number]) EKG w/ Interpretation (93000)NSR, no ischemia  BP today: 150/86 Prior BP: 144/90 (11/05/2009)  Labs Reviewed: K+: 4.5 (07/10/2009) Creat: : 0.56 (07/10/2009)   Chol: 210 (11/17/2008)   HDL: 52 (11/17/2008)   LDL: 129 (11/17/2008)   TG: 146 (11/17/2008)  Problem # 5:  DM (ICD-250.00) Assessment: Comment Only  The following medications were removed from the medication list:    Levemir Flexpen 100 Unit/ml Soln (Insulin detemir) .Marland KitchenMarland KitchenMarland KitchenMarland Kitchen 10 units subcutaneously at bedtime Her updated medication list for this problem includes:    Metformin Hcl 1000 Mg Tabs (Metformin hcl) .Marland Kitchen... Take 1 tablet by mouth two times a day    Lisinopril-hydrochlorothiazide 20-12.5 Mg Tabs (Lisinopril-hydrochlorothiazide) .Marland Kitchen... Take 2 every morning for high blood pressure    Levemir Flexpen 100 Unit/ml Soln (Insulin detemir) .Marland KitchenMarland KitchenMarland KitchenMarland Kitchen 60 units at bedtime, she will  titrate  up as needed Patient advised to reduce carbs and sweets, commit to regular physical activity, take meds as prescribed, test blood sugars as directed, and attempt to lose weight , to improve blood sugar control.  Orders: T-Urine Microalbumin w/creat. ratio [id number]) T- Hemoglobin A1C [id number])  Labs Reviewed: Creat: 0.56 (07/10/2009)    Reviewed HgBA1c results: 12.5 (08/11/2009)  >14 (11/04/2008)  Problem # 6:  OBESITY (ICD-278.00) Assessment: Deteriorated  Ht: 64 (12/07/2009)   Wt: 267.25 (12/07/2009)   BMI: 46.04 (12/07/2009) therapeutic lifestyle change discussed  and encouraged  Complete Medication List: 1)  Metformin Hcl 1000 Mg Tabs (Metformin hcl) .... Take 1 tablet by mouth two times a day 2)  Lisinopril-hydrochlorothiazide 20-12.5 Mg Tabs (Lisinopril-hydrochlorothiazide) .... Take 2 every morning for high blood pressure 3)  Nystatin-triamcinolone 100000-0.1 Unit/gm-% Crea (Nystatin-triamcinolone) .... Apply to affected area two times a day as needed 4)  Levemir Flexpen 100 Unit/ml Soln (Insulin detemir) .... 60 units at bedtime  Other Orders: T-Hepatic Function [id number]) T-Lipid Profile [id number]) T-CBC No Diff ([id number]) T-TSH ([id number])  Patient Instructions: 1)  Please schedule a follow-up appointment in 2 months. 2)  It is important that you exercise regularly at least 30 minutes 5 times a week. If you develop chest pain, have severe difficulty breathing, or feel very tired , stop exercising immediately and seek medical attention. 3)  You need to lose weight. Consider a lower calorie diet and regular exercise.  4)  BMP prior to visit, ICD-9: 5)  Hepatic Panel prior to visit, ICD-9: 6)  Lipid Panel prior to visit, ICD-9: 7)  TSH prior to visit, ICD-9:   fasting  today 8)  CBC w/ Diff prior to visit, ICD-9: 9)  HbgA1C prior to visit, ICD-9: 10)  We will schedule a mamo ,also an mRi of you rlow back Prescriptions: LEVEMIR FLEXPEN 100 UNIT/ML SOLN (INSULIN DETEMIR) 60 units at bedtime  #1800 units x 3   Entered by:   Everitt Amber LPN   Authorized by:   Syliva Overman MD   Signed by:   Everitt Amber LPN on 42/59/5638   Method used:   Printed then faxed to ...       [facility]  [address] (retail)       [address]       [place], [place]  [address]       Ph: [phone]       Fax: [phone]   RxID:   [id number] LEVEMIR FLEXPEN 100 UNIT/ML SOLN (INSULIN DETEMIR) 60 units at bedtime  #1800 units x 3   Entered and Authorized by:   Syliva Overman MD   Signed by:   Syliva Overman MD on  12/07/2009   Method used:   Printed then faxed to ...       [facility]  [address] (retail)       [address]       [place], [place]  [address]       Ph: [phone]       Fax: [phone]   RxID:   [id number]    Laboratory Results    Stool - Occult Blood Hemmoccult #1: negative Date: 12/07/2009 Comments: 51301 13L 10/13 118 1012

## 2010-04-06 NOTE — Miscellaneous (Signed)
Summary: new insulin  Clinical Lists Changes  Medications: Added new medication of LEVEMIR FLEXPEN 100 UNIT/ML SOLN (INSULIN DETEMIR) 10 units subcutaneously at bedtime  Appended Document: new insulin Discussion with pt during nurse visit. Pt states she is only taking Metformin and Glipizide once daily.  Advised pt to discontinue Glipizide since we are initiating Insulin.  Asked pt to increase her Metformin to two times a day.  Pt is having financial difficulty affording the Metformin even on the [facility] $4 list. She states she is taking her BP meds 2 daily as prescribed.  Advised pt that her BP is still running too high and I really need to add another BP.  Pt declined.  Will have a f/u appt in one mos & wishes to see what her BP isat that time. Again discussed resources for pt, including [facility].  Encouraged pt to follow thru with applying for medication assistance programs.  Discussed with pt that though we want to continue providing her medical care,that we understand her financial situation, and it may be a good idea for her to find a provider closer to home.  Pt states she wishes to continue coming here.

## 2010-04-06 NOTE — Assessment & Plan Note (Signed)
Summary: F UP   Vital Signs:  Patient profile:   50 year old female Menstrual status:  regular Weight:      262.04 pounds O2 Sat:      97 % on Room air Pulse rate:   87 / minute Pulse rhythm:   regular BP sitting:   120 / 60  (right arm)  O2 Flow:  Room air  History of Present Illness: Reports  that she has been doing fairly well. Denies recent fever or chills. Denies sinus pressure, nasal congestion , ear pain or sore throat. Denies chest congestion, or cough productive of sputum. Denies chest pain, palpitations, PND, orthopnea or leg swelling. Denies abdominal pain, nausea, vomitting, diarrhea or constipation. Denies change in bowel movements or bloody stool. Denies dysuria , frequency, incontinence or hesitancy.  Denies headaches, vertigo, seizures. c/o anxiety , stress and mild depression, she has no income effectively, and is challenged by this.   Denies  rash, lesions, or itch.     Allergies: No Known Drug Allergies  Review of Systems      See HPI General:  Complains of fatigue. Eyes:  Denies blurring and discharge. 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 Endo:  Complains of excessive urination; not testing regularly, needs levemir. Heme:  Denies abnormal bruising and bleeding. Allergy:  Denies hives or rash and itching eyes.  Physical Exam  General:  Well-developed,obese,in no acute distress; alert,appropriate and cooperative throughout examination HEENT: No facial asymmetry,  EOMI, No sinus tenderness, TM's Clear, oropharynx  pink and moist.   Chest: Clear to auscultation bilaterally.  CVS: S1, S2, No murmurs, No S3.   Abd: Soft, Nontender.  MS: Adequate ROM spine, hips, shoulders and knees.  Ext: No edema.   CNS: CN 2-12 intact, power tone and sensation normal throughout.   Skin: Intact, no visible lesions or rashes.  Psych: Good eye contact, normal affect.  Memory  intact, not anxious or depressed appearing.   Diabetes Management Exam:    Foot Exam (with socks and/or shoes not present):       Sensory-Monofilament:          Left foot: diminished          Right foot: diminished       Inspection:          Left foot: normal          Right foot: normal       Nails:          Left foot: normal          Right foot: normal   Impression & Recommendations:  Problem # 1:  DM (ICD-250.00) Assessment Comment Only  Her updated medication list for this problem includes:    Metformin Hcl 1000 Mg Tabs (Metformin hcl) .Marland Kitchen... Take 1 tablet by mouth two times a day    Lisinopril-hydrochlorothiazide 20-12.5 Mg Tabs (Lisinopril-hydrochlorothiazide) .Marland Kitchen... Take 2 every morning for high blood pressure    Levemir Flexpen 100 Unit/ml Soln (Insulin detemir) .Marland KitchenMarland KitchenMarland KitchenMarland Kitchen 60 units at bedtime    Glipizide 10 Mg Xr24h-tab (Glipizide) ..... Marland Kitchenone tablet every morning at breakfast  Orders: T- Hemoglobin A1C ([id number])  Labs Reviewed: Creat: 0.64 (12/15/2009)    Reviewed HgBA1c results: 9.3 (12/15/2009)  12.5 (08/11/2009)  Problem # 2:  OBESITY (ICD-278.00) Assessment: Unchanged  Ht: 64 (12/07/2009)   Wt: 262.04 (02/09/2010)   BMI: 46.04 (12/07/2009)  Problem #  3:  HYPERLIPIDEMIA (ICD-272.4) Assessment: Deteriorated  The following medications were removed from the medication list:    Lovastatin 40 Mg Tabs (Lovastatin) .Marland Kitchen... Take 1 tab by mouth at bedtime Her updated medication list for this problem includes:    Crestor 10 Mg Tabs (Rosuvastatin calcium) .Marland Kitchen... Take 1 tab by mouth at bedtime monday through friday Low fat dietdiscussed and encouraged  Orders: T-Lipid Profile [phone]) T-Hepatic Function [id number])  Labs Reviewed: SGOT: 13 (12/15/2009)   SGPT: 10 (12/15/2009)   HDL:39 (12/15/2009), 52 (11/17/2008)  LDL:141 (12/15/2009), 129 (11/17/2008)  Chol:193 (12/15/2009), 210 (11/17/2008)  Trig:65 (12/15/2009), 146 (11/17/2008)  Problem # 4:  HYPERTENSION  (ICD-401.9) Assessment: Improved  Her updated medication list for this problem includes:    Lisinopril-hydrochlorothiazide 20-12.5 Mg Tabs (Lisinopril-hydrochlorothiazide) .Marland Kitchen... Take 2 every morning for high blood pressure  Orders: T-Basic Metabolic Panel [phone])  BP today: 120/60 Prior BP: 150/86 (12/07/2009)  Labs Reviewed: K+: 4.3 (12/15/2009) Creat: : 0.64 (12/15/2009)   Chol: 193 (12/15/2009)   HDL: 39 (12/15/2009)   LDL: 141 (12/15/2009)   TG: 65 (12/15/2009)  Complete Medication List: 1)  Metformin Hcl 1000 Mg Tabs (Metformin hcl) .... Take 1 tablet by mouth two times a day 2)  Lisinopril-hydrochlorothiazide 20-12.5 Mg Tabs (Lisinopril-hydrochlorothiazide) .... Take 2 every morning for high blood pressure 3)  Nystatin-triamcinolone 100000-0.1 Unit/gm-% Crea (Nystatin-triamcinolone) .... Apply to affected area two times a day as needed 4)  Levemir Flexpen 100 Unit/ml Soln (Insulin detemir) .... 60 units at bedtime 5)  Glipizide 10 Mg Xr24h-tab (Glipizide) .... .one tablet every morning at breakfast 6)  Crestor 10 Mg Tabs (Rosuvastatin calcium) .... Take 1 tab by mouth at bedtime monday through friday  Other Orders: Depo- Medrol 80mg  (J1040) Ketorolac-Toradol 15mg  (G2952) Admin of Therapeutic Inj  intramuscular or subcutaneous (84132)  Patient Instructions: 1)  Please schedule a follow-up appointment in 3 months. 2)  It is important that you exercise regularly at least 20 minutes 5 times a week. If you develop chest pain, have severe difficulty breathing, or feel very tired , stop exercising immediately and seek medical attention. 3)  You need to lose weight. Consider a lower calorie diet and regular exercise. Pls take meds as directed. 4)  New med , crestor for your cholesterol. 5)  Injections today for back pain   Medication Administration  Injection # 1:    Medication: Depo- Medrol 80mg     Diagnosis: BACK PAIN WITH RADICULOPATHY (ICD-729.2)    Route: IM     Site: RUOQ gluteus    Exp Date: 06/12    Lot #: [id number]    Mfr: Pharmacia    Patient tolerated injection without complications    Given by: Adella Hare LPN (February 09, 2010 5:11 PM)  Injection # 2:    Medication: Ketorolac-Toradol 15mg     Diagnosis: BACK PAIN WITH RADICULOPATHY (ICD-729.2)    Route: IM    Site: LUOQ gluteus    Exp Date: 01/06/2011    Lot #: [id number]    Mfr: [email or web address]    Comments: toradol 60mg  given    Patient tolerated injection without complications    Given by: Adella Hare LPN (February 09, 2010 5:12 PM)  Orders Added: 1)  Est. Patient Level IV [99214] 2)  T-Basic Metabolic Panel [[id number]] 3)  T-Lipid Profile [80061-22930] 4)  T-Hepatic Function [80076-22960] 5)  T- Hemoglobin A1C [83036-23375] 6)  Depo- Medrol 80mg  [J1040] 7)  Ketorolac-Toradol 15mg  [J1885] 8)  Admin of Therapeutic Inj  intramuscular or subcutaneous [74259]  levemir samples given lot [id number] exp 08/13 crestor samples given lot [id number] exp 02/14  Medication Administration  Injection # 1:    Medication: Depo- Medrol 80mg     Diagnosis: BACK PAIN WITH RADICULOPATHY (ICD-729.2)    Route: IM    Site: RUOQ gluteus    Exp Date: 06/12    Lot #: [id number]    Mfr: Pharmacia    Patient tolerated injection without complications    Given by: Adella Hare LPN (February 09, 2010 5:11 PM)  Injection # 2:    Medication: Ketorolac-Toradol 15mg     Diagnosis: BACK PAIN WITH RADICULOPATHY (ICD-729.2)    Route: IM    Site: LUOQ gluteus    Exp Date: 01/06/2011    Lot #: [id number]    Mfr: [email or web address]    Comments: toradol 60mg  given    Patient tolerated injection without complications    Given by: Adella Hare LPN (February 09, 2010 5:12 PM)  Orders Added: 1)  Est. Patient Level IV [99214] 2)  T-Basic Metabolic Panel [[id number]] 3)  T-Lipid Profile [80061-22930] 4)  T-Hepatic Function [80076-22960] 5)  T- Hemoglobin A1C [83036-23375] 6)  Depo- Medrol 80mg  [J1040] 7)  Ketorolac-Toradol  15mg  [J1885] 8)  Admin of Therapeutic Inj  intramuscular or subcutaneous [24401]

## 2010-04-06 NOTE — Progress Notes (Signed)
  Phone Note Outgoing Call   Summary of Call: Phone call to pt.  Left msg that I received approval letter for financial assistance for her Levemir insulin. Advised pt to call the office and schedule an appt for f/u appt in 1-2 weeks.  To bring her blood sugar readings with her, and will be able to pick up her shipment of insulin at that time. Initial call taken by: Esperanza Sheets PA,  October 16, 2009 10:16 AM

## 2010-04-06 NOTE — Progress Notes (Signed)
Summary: medicine  Phone Note Call from Patient   Summary of Call: the company that u told her to call she talked to [facility] and she told her that the dr office  would have to fax it in and she would have to pick it up here call back at [phone] Initial call taken by: Lind Guest,  February 09, 2010 2:00 PM  Follow-up for Phone Call        noted Follow-up by: Adella Hare LPN,  February 10, 2010 1:22 PM  Additional Follow-up for Phone Call Additional follow up Details #1::        called company for reorder form, to be faxed Additional Follow-up by: Adella Hare LPN,  February 11, 2010 9:58 AM

## 2010-04-06 NOTE — Letter (Signed)
Summary: release of medical info  release of medical info   Imported By: Lind Guest 06/05/2009 14:03:41  _____________________________________________________________________  External Attachment:    Type:   Image     Comment:   External Document

## 2010-04-06 NOTE — Assessment & Plan Note (Signed)
   Vital Signs:  Patient Profile:   50 Years Old Female Height:     64 inches Weight:      280.5 pounds BMI:     48.32 Pulse rate:   82 / minute Resp:     16 per minute BP sitting:   132 / 90  (right arm)  Vitals Entered By: Chipper Herb (October 04, 2007 4:44 PM)                 Chief Complaint:  Follow up.    Current Allergies: No known allergies         Complete Medication List: 1)  Azor 10-40 Mg Tabs (Amlodipine-olmesartan) .... Take 1 tablet by mouth once a day 2)  Tekturna Hct 300-25 Mg Tabs (Aliskiren-hydrochlorothiazide) .... Take 1 tablet by mouth once a day 3)  Glumetza 1000 Mg Xr24h-tab (Metformin hcl) .... Take 1 tablet by mouth two times a day 4)  Lovastatin 20 Mg Tabs (Lovastatin) .... Take 1 tab by mouth at bedtime 5)  Clonidine Hcl 0.2 Mg Tabs (Clonidine hcl) .... Take 1 tablet by mouth two times a day 6)  Singulair 10 Mg Tabs (Montelukast sodium) .... Take 1 tablet by mouth once a day 7)  Glipizide 2.5 Mg Xr24h-tab (Glipizide) .... Take three tablets by mouth twice daily    ] See paper chart for complete visit record.

## 2010-04-06 NOTE — Letter (Signed)
Summary: medical release  medical release   Imported By: Lind Guest 12/07/2009 16:39:51  _____________________________________________________________________  External Attachment:    Type:   Image     Comment:   External Document

## 2010-04-13 ENCOUNTER — Telehealth: Payer: Self-pay | Admitting: Family Medicine

## 2010-04-22 NOTE — Progress Notes (Signed)
Summary: MEDICINE  Phone Note Call from Patient   Summary of Call: RX FOR THE SYRNIGE FOR THE INSULIN PLEASE SEND TO [facility]  CALL BACK AT [phone] Initial call taken by: Lind Guest,  April 13, 2010 1:42 PM    New/Updated Medications: BD INSULIN SYRINGE ULTRAFINE 30G X 1/2" 1 ML MISC (INSULIN SYRINGE-NEEDLE U-100) once daily use with insulin dx:250.01 Prescriptions: BD INSULIN SYRINGE ULTRAFINE 30G X 1/2" 1 ML MISC (INSULIN SYRINGE-NEEDLE U-100) once daily use with insulin dx:250.01  #100 x 2   Entered by:   Adella Hare LPN   Authorized by:   Syliva Overman MD   Signed by:   Adella Hare LPN on 45/40/9811   Method used:   Electronically to        [facility]  [address] (retail)       [address]       [place], [place]  [address]       Ph: [phone]       Fax: [phone]   RxID:   [id number]

## 2010-05-10 ENCOUNTER — Encounter: Payer: Self-pay | Admitting: Family Medicine

## 2010-05-11 ENCOUNTER — Ambulatory Visit (HOSPITAL_COMMUNITY)
Admission: RE | Admit: 2010-05-11 | Discharge: 2010-05-11 | Disposition: A | Discharge status: Home / Self Care | Payer: Self-pay | Source: Ambulatory Visit | Attending: Family Medicine | Admitting: Family Medicine

## 2010-05-11 ENCOUNTER — Ambulatory Visit (INDEPENDENT_AMBULATORY_CARE_PROVIDER_SITE_OTHER): Payer: Self-pay | Admitting: Family Medicine

## 2010-05-11 ENCOUNTER — Other Ambulatory Visit: Payer: Self-pay | Admitting: Family Medicine

## 2010-05-11 ENCOUNTER — Encounter: Payer: Self-pay | Admitting: Family Medicine

## 2010-05-11 DIAGNOSIS — M7730 Calcaneal spur, unspecified foot: Secondary | ICD-10-CM | POA: Insufficient documentation

## 2010-05-11 DIAGNOSIS — E785 Hyperlipidemia, unspecified: Secondary | ICD-10-CM

## 2010-05-11 DIAGNOSIS — E119 Type 2 diabetes mellitus without complications: Secondary | ICD-10-CM

## 2010-05-11 DIAGNOSIS — M25579 Pain in unspecified ankle and joints of unspecified foot: Secondary | ICD-10-CM | POA: Insufficient documentation

## 2010-05-11 DIAGNOSIS — M79609 Pain in unspecified limb: Secondary | ICD-10-CM

## 2010-05-11 DIAGNOSIS — M79673 Pain in unspecified foot: Secondary | ICD-10-CM

## 2010-05-11 DIAGNOSIS — I10 Essential (primary) hypertension: Secondary | ICD-10-CM

## 2010-05-12 LAB — LIPID PANEL
Cholesterol: 215 mg/dL — ABNORMAL HIGH (ref 0–200)
HDL: 41 mg/dL (ref >39)
LDL Cholesterol: 157 mg/dL — ABNORMAL HIGH (ref 0–99)
Total CHOL/HDL Ratio: 5.2 Ratio
Triglycerides: 85 mg/dL (ref <150)
VLDL: 17 mg/dL (ref 0–40)

## 2010-05-12 LAB — HEPATIC FUNCTION PANEL
ALT: 14 U/L (ref 0–35)
AST: 14 U/L (ref 0–37)
Albumin: 4.1 g/dL (ref 3.5–5.2)
Alkaline Phosphatase: 63 U/L (ref 39–117)
Bilirubin, Direct: 0.1 mg/dL (ref 0.0–0.3)
Indirect Bilirubin: 0.3 mg/dL (ref 0.0–0.9)
Total Bilirubin: 0.4 mg/dL (ref 0.3–1.2)
Total Protein: 7.2 g/dL (ref 6.0–8.3)

## 2010-05-12 LAB — BASIC METABOLIC PANEL
BUN: 7 mg/dL (ref 6–23)
CO2: 25 mEq/L (ref 19–32)
Calcium: 9 mg/dL (ref 8.4–10.5)
Chloride: 103 mEq/L (ref 96–112)
Creat: 0.57 mg/dL (ref 0.40–1.20)
Glucose, Bld: 123 mg/dL — ABNORMAL HIGH (ref 70–99)
Potassium: 4.2 mEq/L (ref 3.5–5.3)
Sodium: 140 mEq/L (ref 135–145)

## 2010-05-12 LAB — HEMOGLOBIN A1C
Hgb A1c MFr Bld: 8.9 % — ABNORMAL HIGH (ref <5.7)
Mean Plasma Glucose: 209 mg/dL — ABNORMAL HIGH (ref <117)

## 2010-05-13 ENCOUNTER — Encounter: Payer: Self-pay | Admitting: Family Medicine

## 2010-05-14 LAB — CONVERTED CEMR LAB
ALT: 14 units/L (ref 0–35)
AST: 14 units/L (ref 0–37)
Albumin: 4.1 g/dL (ref 3.5–5.2)
Alkaline Phosphatase: 63 units/L (ref 39–117)
BUN: 7 mg/dL (ref 6–23)
Bilirubin, Direct: 0.1 mg/dL (ref 0.0–0.3)
CO2: 25 meq/L (ref 19–32)
Calcium: 9 mg/dL (ref 8.4–10.5)
Chloride: 103 meq/L (ref 96–112)
Cholesterol: 215 mg/dL — ABNORMAL HIGH (ref 0–200)
Creatinine, Ser: 0.57 mg/dL (ref 0.40–1.20)
Glucose, Bld: 123 mg/dL — ABNORMAL HIGH (ref 70–99)
HDL: 41 mg/dL (ref >39)
Hgb A1c MFr Bld: 8.9 % — ABNORMAL HIGH (ref <5.7)
Indirect Bilirubin: 0.3 mg/dL (ref 0.0–0.9)
LDL Cholesterol: 157 mg/dL — ABNORMAL HIGH (ref 0–99)
Potassium: 4.2 meq/L (ref 3.5–5.3)
Sodium: 140 meq/L (ref 135–145)
Total Bilirubin: 0.4 mg/dL (ref 0.3–1.2)
Total CHOL/HDL Ratio: 5.2
Total Protein: 7.2 g/dL (ref 6.0–8.3)
Triglycerides: 85 mg/dL (ref <150)
VLDL: 17 mg/dL (ref 0–40)

## 2010-05-25 NOTE — Assessment & Plan Note (Signed)
Summary: f up   Vital Signs:  Patient profile:   50 year old female Menstrual status:  regular Height:      64 inches Weight:      273 pounds BMI:     47.03 O2 Sat:      97 % Pulse rate:   83 / minute Pulse rhythm:   regular Resp:     16 per minute BP sitting:   174 / 104  (left arm) Cuff size:   xl  Vitals Entered By: Everitt Amber LPN (May 10, 4780 1:41 PM)  Nutrition Counseling: Patient's BMI is greater than 25 and therefore counseled on weight management options. CC: Follow up chronic problems, heel of right foot has been hurting her. Stressed out over not being able to find a job    CC:  Follow up chronic problems and heel of right foot has been hurting her. Stressed out over not being able to find a job .  History of Present Illness: Right heel pain x 2 months, worse when she just starts walking.  Had been out of testing supplies, blood sugars fluctuate from 122 to 189.Nottaking glipizide, no money. c/o itchy watery eyes , and excessive sneezing.  Denies recent fever or chills. Denies sinus pressure, nasal congestion , ear pain or sore throat. Denies chest congestion, or cough productive of sputum. Denies chest pain, palpitations, PND, orthopnea or leg swelling. Denies abdominal pain, nausea, vomitting, diarrhea or constipation. Denies change in bowel movements or bloody stool. Denies dysuria , frequency, incontinence or hesitancy. Denies  joint pain, swelling, or reduced mobility. Denies headaches, vertigo, seizures.  Denies  rash, lesions, or itch.      Current Medications (verified): 1)  Metformin Hcl 1000 Mg Tabs (Metformin Hcl) .... Take 1 Tablet By Mouth Two Times A Day 2)  Lisinopril-Hydrochlorothiazide 20-12.5 Mg Tabs (Lisinopril-Hydrochlorothiazide) .... Take 2 Every Morning For High Blood Pressure 3)  Levemir Flexpen 100 Unit/ml Soln (Insulin Detemir) .... 60 Units At Bedtime 4)  Glipizide 10 Mg Xr24h-Tab (Glipizide) .... .one Tablet Every Morning At  Breakfast 5)  Crestor 10 Mg Tabs (Rosuvastatin Calcium) .... Take 1 Tab By Mouth At Bedtime Monday Through Friday 6)  Bd Insulin Syringe Ultrafine 30g X 1/2" 1 Ml Misc (Insulin Syringe-Needle U-100) .... Once Daily Use With Insulin Dx:250.01  Allergies (verified): No Known Drug Allergies  Review of Systems      See HPI General:  Complains of fatigue. Eyes:  Complains of vision loss-both eyes. MS:  Complains of joint pain, low back pain, and mid back pain. Psych:  Complains of anxiety, depression, and easily tearful; denies mental problems, suicidal thoughts/plans, thoughts of violence, and unusual visions or sounds; driven by poor psychosocial situation. Endo:  Complains of excessive thirst and excessive urination. Heme:  Denies abnormal bruising and bleeding. Allergy:  Complains of seasonal allergies.  Physical Exam  General:  Well-developed,obese,in no acute distress; alert,appropriate and cooperative throughout examination HEENT: No facial asymmetry,  EOMI, No sinus tenderness, TM's Clear, oropharynx  pink and moist.   Chest: Clear to auscultation bilaterally.  CVS: S1, S2, No murmurs, No S3.   Abd: Soft, Nontender.  MS: Adequate ROM spine, hips, shoulders and knees. Tnder over heel Ext: No edema.   CNS: CN 2-12 intact, power tone and sensation normal throughout.   Skin: Intact, no visible lesions or rashes.  Psych: Good eye contact, normal affect.  Memory intact,tearful and  depressed appearing.   Diabetes Management Exam:    Foot  Exam (with socks and/or shoes not present):       Sensory-Monofilament:          Left foot: diminished          Right foot: diminished       Inspection:          Left foot: normal          Right foot: normal       Nails:          Left foot: normal          Right foot: normal   Impression & Recommendations:  Problem # 1:  HEEL PAIN, RIGHT (ICD-729.5) Assessment Comment Only  Orders: Radiology other (Radiology Other) Ketorolac-Toradol  15mg  (Y7829) Admin of Therapeutic Inj  intramuscular or subcutaneous (56213)  Problem # 2:  HYPERTENSION (ICD-401.9) Assessment: Deteriorated  The following medications were removed from the medication list:    Lisinopril-hydrochlorothiazide 20-12.5 Mg Tabs (Lisinopril-hydrochlorothiazide) .Marland Kitchen... Take 2 every morning for high blood pressure Her updated medication list for this problem includes:non compliant    Lisinopril-hydrochlorothiazide 20-12.5 Mg Tabs (Lisinopril-hydrochlorothiazide) .Marland Kitchen..Marland Kitchen Two tablets once daily  BP today: 174/104 Prior BP: 120/60 (02/09/2010)  Labs Reviewed: K+: 4.3 (12/15/2009) Creat: : 0.64 (12/15/2009)   Chol: 193 (12/15/2009)   HDL: 39 (12/15/2009)   LDL: 141 (12/15/2009)   TG: 65 (12/15/2009)  Problem # 3:  DM (ICD-250.00) Assessment: Comment Only  The following medications were removed from the medication list:    Metformin Hcl 1000 Mg Tabs (Metformin hcl) .Marland Kitchen... Take 1 tablet by mouth two times a day    Lisinopril-hydrochlorothiazide 20-12.5 Mg Tabs (Lisinopril-hydrochlorothiazide) .Marland Kitchen... Take 2 every morning for high blood pressure Her updated medication list for this problem includes:    Levemir Flexpen 100 Unit/ml Soln (Insulin detemir) .Marland KitchenMarland KitchenMarland KitchenMarland Kitchen 60 units at bedtime    Glipizide 10 Mg Xr24h-tab (Glipizide) ..... Marland Kitchenone tablet every morning at breakfast    Lisinopril-hydrochlorothiazide 20-12.5 Mg Tabs (Lisinopril-hydrochlorothiazide) .Marland Kitchen..Marland Kitchen Two tablets once daily    Kombiglyze Xr 2.07-998 Mg Xr24h-tab (Saxagliptin-metformin) .Marland Kitchen... Take 1 tablet by mouth once a day  Labs Reviewed: Creat: 0.64 (12/15/2009)    Reviewed HgBA1c results: 9.3 (12/15/2009)  12.5 (08/11/2009)  Problem # 4:  HYPERLIPIDEMIA (ICD-272.4) Assessment: Comment Only  Labs Reviewed: SGOT: 13 (12/15/2009)   SGPT: 10 (12/15/2009)   HDL:39 (12/15/2009), 52 (11/17/2008)  LDL:141 (12/15/2009), 129 (11/17/2008)  Chol:193 (12/15/2009), 210 (11/17/2008)  Trig:65 (12/15/2009), 146  (11/17/2008) non compliant with meds  Complete Medication List: 1)  Levemir Flexpen 100 Unit/ml Soln (Insulin detemir) .... 60 units at bedtime 2)  Glipizide 10 Mg Xr24h-tab (Glipizide) .... .one tablet every morning at breakfast 3)  Crestor 10 Mg Tabs (Rosuvastatin calcium) .... Take 1 tab by mouth at bedtime monday through friday 4)  Bd Insulin Syringe Ultrafine 30g X 1/2" 1 Ml Misc (Insulin syringe-needle u-100) .... Once daily use with insulin dx:250.01 5)  Lisinopril-hydrochlorothiazide 20-12.5 Mg Tabs (Lisinopril-hydrochlorothiazide) .... Two tablets once daily 6)  Kombiglyze Xr 2.07-998 Mg Xr24h-tab (Saxagliptin-metformin) .... Take 1 tablet by mouth once a day 7)  Ibuprofen 800 Mg Tabs (Ibuprofen) .... Take 1 tablet by mouth three times a day  Patient Instructions: 1)  Please schedule a follow-up appointment in 4 months. 2)  We will call with lab results . 3)  You have heel  pain due to spurs. 4)  You can have an xray  of the right heel and an injection for pain today 5)  You will take kombiglyze in place of  metformin, we will provide samples. 6)  No other med changes, now and pls try to get your meds, I hope your situation improves Prescriptions: KOMBIGLYZE XR 2.07-998 MG XR24H-TAB (SAXAGLIPTIN-METFORMIN) Take 1 tablet by mouth once a day  #30 x 0   Entered and Authorized by:   Syliva Overman MD   Signed by:   Syliva Overman MD on 05/11/2010   Method used:   Print then Give to Patient   RxID:   [id number] LISINOPRIL-HYDROCHLOROTHIAZIDE 20-12.5 MG TABS (LISINOPRIL-HYDROCHLOROTHIAZIDE) two tablets once daily  #60 x 3   Entered and Authorized by:   Syliva Overman MD   Signed by:   Syliva Overman MD on 05/11/2010   Method used:   Electronically to        [facility]  [address] (retail)       [address]       [place], [place]  [address]       Ph: [phone]       Fax: [phone]   RxID:   [id number]    Medication  Administration  Injection # 1:    Medication: Ketorolac-Toradol 15mg     Diagnosis: HEEL PAIN, RIGHT (ICD-729.5)    Route: IM    Site: RUOQ gluteus    Exp Date: 12/13    Lot #: [id number]    Mfr: Aleene Davidson    Comments: 60mg  given    Patient tolerated injection without complications    Given by: Adella Hare LPN (May 10, 628 2:36 PM)  Orders Added: 1)  Est. Patient Level IV [16010] 2)  Radiology other [Radiology Other] 3)  Ketorolac-Toradol 15mg  [J1885] 4)  Admin of Therapeutic Inj  intramuscular or subcutaneous [96372]    kombiglyze xr samples given [id number] 5/13  Medication Administration  Injection # 1:    Medication: Ketorolac-Toradol 15mg     Diagnosis: HEEL PAIN, RIGHT (ICD-729.5)    Route: IM    Site: RUOQ gluteus    Exp Date: 12/13    Lot #: [id number]    Mfr: Aleene Davidson    Comments: 60mg  given    Patient tolerated injection without complications    Given by: Adella Hare LPN (May 10, 7060 2:36 PM)  Orders Added: 1)  Est. Patient Level IV [37628] 2)  Radiology other [Radiology Other] 3)  Ketorolac-Toradol 15mg  [J1885] 4)  Admin of Therapeutic Inj  intramuscular or subcutaneous [96372]  Appended Document: f up pls request 4 months of kombiglyze for Ruwayda Rooks and, ask the rep about this if we have none pls  Appended Document: f up called and requested 4 months and she will be by today

## 2010-07-05 ENCOUNTER — Encounter: Payer: Self-pay | Admitting: Family Medicine

## 2010-07-06 ENCOUNTER — Ambulatory Visit (INDEPENDENT_AMBULATORY_CARE_PROVIDER_SITE_OTHER): Payer: Self-pay | Admitting: Family Medicine

## 2010-07-06 ENCOUNTER — Encounter: Payer: Self-pay | Admitting: Family Medicine

## 2010-07-06 VITALS — BP 150/80 | HR 112 | Resp 16 | Ht 65.0 in | Wt 259.0 lb

## 2010-07-06 DIAGNOSIS — E785 Hyperlipidemia, unspecified: Secondary | ICD-10-CM

## 2010-07-06 DIAGNOSIS — E669 Obesity, unspecified: Secondary | ICD-10-CM

## 2010-07-06 DIAGNOSIS — IMO0001 Reserved for inherently not codable concepts without codable children: Secondary | ICD-10-CM

## 2010-07-06 DIAGNOSIS — I10 Essential (primary) hypertension: Secondary | ICD-10-CM

## 2010-07-06 DIAGNOSIS — E119 Type 2 diabetes mellitus without complications: Secondary | ICD-10-CM

## 2010-07-06 MED ORDER — LISINOPRIL-HYDROCHLOROTHIAZIDE 20-12.5 MG PO TABS: 2.0000 | ORAL_TABLET | Freq: Every day | ORAL | Status: DC

## 2010-07-06 MED ORDER — CLONIDINE HCL 0.2 MG PO TABS: 0.2000 mg | ORAL_TABLET | Freq: Every day | ORAL | Status: DC

## 2010-07-06 MED ORDER — SAXAGLIPTIN-METFORMIN ER 2.5-1000 MG PO TB24: 2.0000 | ORAL_TABLET | Freq: Every day | ORAL | Status: DC

## 2010-07-06 MED ORDER — ROSUVASTATIN CALCIUM 10 MG PO TABS: 10.0000 mg | ORAL_TABLET | Freq: Every day | ORAL | Status: AC

## 2010-07-06 NOTE — Assessment & Plan Note (Signed)
Medication compliance addressed. Commitment to regular exercise and healthy  food choices, with portion control discussed. DASH diet and low fat diet discussed and literature offered. Changes in medication made at this visit.  

## 2010-07-06 NOTE — Patient Instructions (Signed)
F/u as before.  Pls invest in test strips , this is mandatory, it is absolutely unsafe to take med that you do without testing suppiles.  Glucotrol/glipizide  is being stopped since you have not been taking it  Start levemir at 35 units with your 2 kombiglyze tablets, and inc  Every 3 days by 3 units if average fasting sugar is over 130   bP is 150/82, additional med to be started, clonidine at tnight  crestor is to be taken every night

## 2010-07-11 ENCOUNTER — Encounter: Payer: Self-pay | Admitting: Family Medicine

## 2010-07-11 DIAGNOSIS — E785 Hyperlipidemia, unspecified: Secondary | ICD-10-CM | POA: Insufficient documentation

## 2010-07-11 DIAGNOSIS — I10 Essential (primary) hypertension: Secondary | ICD-10-CM | POA: Insufficient documentation

## 2010-07-11 NOTE — Assessment & Plan Note (Signed)
Uncontrolled, impt of following dietary guidelines, med compliance and reg exercise stressed

## 2010-07-11 NOTE — Assessment & Plan Note (Signed)
Uncontrolled , low fa tdiet encouraged and discussed, also the impt of med compliance

## 2010-07-11 NOTE — Assessment & Plan Note (Signed)
Uncontrolled however non compliance with medication a continued challenge

## 2010-07-11 NOTE — Progress Notes (Signed)
  Subjective:    Patient ID: Michelle [name], female    DOB: 03/10/1960, 50 y.o.   MRN: [id number]  HPI HYPERTENSION Disease Monitoring Blood pressure range-unknown Chest pain- no      Dyspnea- no Medications Compliance- poor Lightheadedness- no   Edema- no   DIABETES Disease Monitoring Blood Sugar ranges-unknown Polyuria- no New Visual problems- no Medications Compliance- poor Hypoglycemic symptoms- no   HYPERLIPIDEMIA Disease Monitoring See symptoms for Hypertension Medications Compliance- poor RUQ pain- no  Muscle aches- no  C/o bilateral heel pain States she is relocating to [place] to her ex-spouse , unable to find work and is hopeful that their relationship will mend.She is advised to find a primary MD in the area where she is living, at this time refusing to consider this despite her mltiple potential life threatening illnesses which are uncontroled    Review of Systems Denies recent fever or chills. Denies sinus pressure, nasal congestion, ear pain or sore throat. Denies chest congestion, productive cough or wheezing. Denies chest pains, palpitations, paroxysmal nocturnal dyspnea, orthopnea and leg swelling Denies abdominal pain, nausea, vomiting,diarrhea or constipation.  Denies rectal bleeding or change in bowel movement. Denies dysuria, frequency, hesitancy or incontinence. Denies  headaches, seizure, numbness, or tingling. Denies depression, anxiety or insomnia. Denies skin break down or rash.        Objective:   Physical Exam Obese female pleasant    Patient alert and oriented and in no Cardiopulmonary distress.  HEENT: No facial asymmetry, EOMI, no sinus tenderness, TM's clear, Oropharynx pink and moist.  Neck supple no adenopathy.  Chest: Clear to auscultation bilaterally.  CVS: S1, S2 no murmurs, no S3.  ABD: Soft non tender. Bowel sounds normal.  Ext: No edema  MS: Adequate ROM spine, shoulders, hips and knees.tender to palpation over  heels  Skin: Intact, no ulcerations or rash noted.  Psych: Good eye contact, normal affect. Memory intact not anxious or depressed appearing.  CNS: CN 2-12 intact, power, tone and sensation normal throughout.  Diabetic Foot Check:  Appearance - no lesions, ulcers or calluses Skin - no unusual pallor or redness Sensation - grossly intact to light touch Monofilament testing -  Right - Great toe, medial, central, lateral ball and posterior foot decreased  Left - Great toe, medial, central, lateral ball and posterior foot decreased Pulses Left - Dorsalis Pedis and Posterior Tibia normal Right - Dorsalis Pedis and Posterior Tibia normal    Assessment & Plan:

## 2010-07-11 NOTE — Assessment & Plan Note (Signed)
Deteriorated. Patient re-educated about  the importance of commitment to a  minimum of 150 minutes of exercise per week. The importance of healthy food choices with portion control discussed. Encouraged to start a food diary, count calories and to consider  joining a support group. Sample diet sheets offered. Goals set by the patient for the next several months.    

## 2010-07-16 ENCOUNTER — Telehealth: Payer: Self-pay | Admitting: Family Medicine

## 2010-07-20 NOTE — Op Note (Signed)
Michelle [name], Michelle [name]               ACCOUNT NO.:  [id number]   MEDICAL RECORD NO.:  [id number]          PATIENT TYPE:  AMB   LOCATION:  DSC                          FACILITY:  [facility]   PHYSICIAN:  Loreta Ave, M.D. 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   POSTOPERATIVE DIAGNOSES:  1. 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  2. Focal grade 3 chondral lesion, medial femoral condyle with a large      chondral loose body.   PROCEDURE:  Left knee exam under anesthesia, arthroscopy, chondroplasty  of patella and medial femoral condyle.  Removal of loose body.  Partial  medial meniscectomy.   SURGEON:  Loreta Ave, M.D.   ASSISTANT:  Zonia Kief, PA   ANESTHESIA:  General   BLOOD LOSS:  Minimal.   SPECIMENS:  None.   CULTURES:  None.   COMPLICATIONS:  None.   DRESSINGS:  Soft compressor.   TOURNIQUET:  Not deployed.   PROCEDURE:  The patient was brought to the operating room, placed on  operating table in supine position.  After adequate anesthesia had been  obtained, knee examined.  Lateral tracking and crepitus but really did  not tether the patellofemoral joint.  Stable ligaments.  Full motion.  Tourniquet leg holder applied.  Leg prepped and draped in usual sterile  fashion.  Three portals created, one superolateral, one each medial and  lateral parapatellar.  Inflow catheter induced and used and arthroscope  was induced and used and inspected.  Grade II and III chondromalacia  peak of the patella lateral facet debrided into a stable surface.  Very  shallow trochlea.  Some lateral tracking but no tethering.  Trochlea  without changes otherwise.  Medial compartment had a large 1.5 cm  diameter chondral loose body removed.  Coming from the middle of the  weightbearing  dome.  With the chondroplasty, grade 3 lesion but not that  deep.  Remaining articular cartilage is good.  Intrameniscal cleavage  tearing posterior third medial meniscus taken out to a stable and  tapered into remaining meniscus.  Lateral meniscus, lateral compartment,  and cruciate ligaments are otherwise normal.  Hypertrophic synovitis in  the medial gutter debrided.  Entire knee examined to be  sure, all loose fragments removed.  Instruments and fluid removed.  Portals of the knee injected Marcaine.  Portal was closed with 4-0  nylon.  Sterile compressive dressing applied.  Anesthesia reversed.  Brought to the recovery room.  Tolerated surgery well.  No  complications.      Loreta Ave, M.D.  Electronically Signed     DFM/MEDQ  D:  06/21/2007  T:  06/22/2007  Job:  [id number]

## 2010-07-23 NOTE — Discharge Summary (Signed)
   Michelle [name], Michelle [name]                           ACCOUNT NO.:  [id number]   MEDICAL RECORD NO.:  [id number]                   PATIENT TYPE:  OBV   LOCATION:  A322                                 FACILITY:  [facility]   PHYSICIAN:  Dirk Dress. Katrinka Blazing, M.D.                DATE OF BIRTH:  September 15, 1960   DATE OF ADMISSION:  01/18/2002  DATE OF DISCHARGE:  01/19/2002                                 DISCHARGE SUMMARY   DISCHARGE DIAGNOSES:  1. Chronic cholecystitis.  2. Hypertension.  3. Morbid obesity.   SPECIAL PROCEDURE:  Laparoscopic cholecystectomy with adhesiolysis.   DISPOSITION:  The patient discharged home in stable satisfactory condition.   DISCHARGE MEDICATIONS:  1. Avalide 300/12.5 mg q.d.  2. Norvasc 10 mg q.d.  3. Allegra 180 mg q.d.  4. Tylox 1 q.4h. p.r.n. pain.   FOLLOW UP:  The patient is scheduled to be seen in the office one week post  discharge.   SUMMARY:  A 50 year old female with a history of postprandial abdominal  pain, nausea, bloating.  She had constant nausea.  Ultrasound was negative  for stones but HIDA scan was strongly positive for biliary dyskinesia.   PAST MEDICAL HISTORY:  History is positive for hypertension, osteoarthritis,  and obesity.   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   PHYSICAL EXAMINATION:  Examination was unremarkable except for epigastric  and right upper quadrant tenderness.   HOSPITAL COURSE:  The patient was admitted through day surgery and underwent  laparoscopic cholecystectomy on November 14.  She had extensive adhesions of  the colon and omentum to the anterior abdominal wall.  The peritoneum was  opened by incision and blunt dissection.  Extension of adhesions had to be  taken down before the procedure could be done.  The procedure otherwise went  very well.  She had no perioperative problems and was discharged home on the  morning of the first postoperative day in satisfactory  condition.                                               Dirk Dress. Katrinka Blazing, M.D.   LCS/MEDQ  D:  02/23/2002  T:  02/25/2002  Job:  [id number]

## 2010-07-23 NOTE — Op Note (Signed)
Michelle [name], Michelle [name]               ACCOUNT NO.:  [id number]   MEDICAL RECORD NO.:  [id number]          PATIENT TYPE:  AMB   LOCATION:  DAY                           FACILITY:  [facility]   PHYSICIAN:  R. Roetta Sessions, M.D. DATE OF BIRTH:  1960-11-08   DATE OF PROCEDURE:  06/29/2006  DATE OF DISCHARGE:                               OPERATIVE REPORT   PROCEDURE:  Surveillance colonoscopy.   INDICATIONS FOR PROCEDURE:  Michelle [name] a 50 year old African-American  female sent over courtesy Milus Mallick. Lodema Hong, M.D. for a surveillance  colonoscopy.  Michelle [name] gives me a history of having a precancerous  polyps removed by Dr. Katrinka Blazing previously.  At one point Michelle [name] tells me Michelle [name]  had a surgical resection of polyps.  Itt could not be removed through  the scope.  Her last colonoscopy in a few years ago without significant  findings.  Michelle [name] has no lower GI tract symptoms currently and denies any  family history colon cancer.  Colonoscopy is now being done as  surveillance maneuver.  This approach has discussed the patient at  length.  Potential risks, benefits and alternatives have been reviewed,  questions answered.  Please see documentation in the medical record.   PROCEDURE NOTE:  O2 saturation, blood pressure, pulse and respirations  monitored the entire procedure.   CONSCIOUS SEDATION:  Demerol 75 mg IV, Versed 3 mg IV in divided doses.   INSTRUMENT:  Pentax video chip system.   FINDINGS:  Digital rectal exam revealed no abnormalities.   ENDOSCOPIC FINDINGS:  The prep was adequate.  Colon:  Colonic mucosa was surveyed from rectosigmoid junction through  the left, transverse, right colon to the area or appendiceal orifice,  ileocecal valve and cecum.  These structures well seen and photographed  for the record.  There was a pill in the base the cecum which was moved  out of the way so the cecum could be well seen including appendiceal  orifice.  From this level scope was withdrawn.  All  previously mentioned  mucosal surfaces were again seen.  The colonic mucosa appeared normal.  No evidence of polyp or neoplasm.  I was unable to identify stigmata of  prior surgery. The rectum was inspected at the conclusion of exam  including a retroflexion view which revealed only a couple anal papilla.  The patient tolerated the procedure, was reacted in endoscopy.   IMPRESSION:  Two anal papilla, otherwise normal rectum.  Normal colon.   RECOMMENDATIONS:  Given her history of colonic polyps would recommend  Michelle [name] return in 5 years for repeat colonoscopy.      Jonathon Bellows, M.D.  Electronically Signed     RMR/MEDQ  D:  06/29/2006  T:  06/29/2006  Job:  [id number]   cc:   Milus Mallick. Lodema Hong, M.D.  Fax: [phone]

## 2010-07-23 NOTE — Op Note (Signed)
Michelle [name], Michelle [name]                           ACCOUNT NO.:  [id number]   MEDICAL RECORD NO.:  [id number]                   PATIENT TYPE:  AMB   LOCATION:  DAY                                  FACILITY:  [facility]   PHYSICIAN:  Jerolyn Shin C. Katrinka Blazing, M.D.                DATE OF BIRTH:  Oct 25, 1960   DATE OF PROCEDURE:  01/18/2002  DATE OF DISCHARGE:                                 OPERATIVE REPORT   PREOPERATIVE DIAGNOSIS:  Chronic cholecystitis.   POSTOPERATIVE DIAGNOSIS:  Chronic cholecystitis with extensive  intraabdominal, peritoneal adhesions.   PROCEDURE:  Laparoscopic cholecystectomy with adhesiolysis.   SURGEON:  Dirk Dress. Katrinka Blazing, M.D.   DESCRIPTION OF PROCEDURE:  Under general anesthesia, the patient's abdomen  was prepped and draped in a sterile field.  A supraumbilical incision was  made and extended down to the fascia.  The fascia was opened with  electrocautery.  Upon getting in the preperitoneal plane, the peritoneum was  identified and was bluntly dissected with a peanut dissector.  Extensive  adhesions of omentum to the anterior abdominal wall were encountered.  Using  blunt dissection, these were separated until a blunt port could be placed  through a small opening in the omentum.  I could not detect any colon to be  stuck to the peritoneum.  The blunt port was secured to the fascia and  wrapped with Vaseline gauze to prevent leak of the CO2.  The abdomen was  then insufflated without difficulty.  A laparoscope was placed and there  were extensive adhesions still present which precluded seeing the  gallbladder.  The port was removed and further dissection was carried out  until another free space could be observed.  The port was replaced and the  laparoscope was placed and the abdomen was reinsufflated.  At this time, the  upper abdomen could be well visualized.  A right upper quadrant paramedian  incision was made and the 10-mm port was placed under videoscopic guidance.  Once this was done, the camera was moved from the supraumbilical port and  placed in the right upper quadrant port and the area surrounding the trocar  in the supraumbilical port could be visualized.  There were extensive  adhesions and it was felt that these adhesions would have to be removed  before we could safely do the procedure.  With the camera in the upper port,  a right upper quadrant incision was made in about the mid axillary line and  a 5-mm port was placed under videoscopic guidance.  Endo Shears were placed  through this port and with the camera giving adequate vision of the  adhesions around the supraumbilical port, I was able to take down the  adhesions surrounding the port site.  The colon was also adherent.  The  adherence was due to the omentum surrounding the colon and not the wall of  the colon proper.  Once these adhesions were taken down, the camera was  moved back to the umbilical port and the right upper quadrant lateral  incision was made and a 5-mm port was placed.  The gallbladder was well  visualized.  It was tightly distended so it was decompressed and bile was  suctioned out after which it was positioned.  The cystic duct was dissected  with five clips and divided.  There were two cystic artery branches.  Each  was dissected, clipped with three clips, and divided.  Using electrocautery,  the gallbladder was separated from the hepatic bed without difficulty.  It  was retrieved intact.  There was no bleeding from the bed and there was no  bile leak from the bed.  The patient tolerated the procedure well.  CO2 was  allowed to escape from the abdomen and the ports were removed.  The incision  at the supraumbilical area was closed with  interrupted 0 Dexon.  The other ports were closed only on the skin using  staples.  The patient tolerated the procedure well.  Dressings were placed.  She was awakened without anesthesia, transferred to a bed, and taken to the   Postanesthetic Care Unit.                                               Dirk Dress. Katrinka Blazing, M.D.    LCS/MEDQ  D:  01/18/2002  T:  01/18/2002  Job:  [id number]

## 2010-07-23 NOTE — Op Note (Signed)
[facility]  Patient:    Michelle [name], Michelle [name]                        MRN: [id number] Proc. Date: 04/30/99 Adm. Date:  [id number] Attending:  Fernande Boyden CC:         Gloris Manchester. Lazarus Salines, M.D.             Syliva Overman, M.D.             [facility]                           Operative Report  PREOPERATIVE DIAGNOSIS: 1. Obstructive sleep apnea. 2. Hypertrophic inferior turbinates with obstruction. 3. Hypertrophic tonsils with obstruction.  POSTOPERATIVE DIAGNOSIS: 1. Obstructive sleep apnea. 2. Hypertrophic inferior turbinates with obstruction. 3. Hypertrophic tonsils with obstruction.  OPERATION: 1. Tonsillectomy. 2. Uvulopalatopharyngoplasty. 3. Bilateral submucous resection, inferior turbinates.  SURGEON:  Gloris Manchester. Lazarus Salines, M.D.  ASSISTANT:  General orotracheal.  ESTIMATED BLOOD LOSS:  Minimal.  COMPLICATIONS:  None.  FINDINGS:  An obese, adult black female with an overall narrow nose, relatively  straight septum, and very large inferior turbinates bilaterally.  A somewhat small mouth with a bulky tongue, 3+ protruding tonsils, a thick uvula, narrow nasopharynx, oropharynx in side-to-side diameter.  DESCRIPTION OF PROCEDURE:  With the patient in a comfortable supine position, general orotracheal anesthesia was induced without difficulty.  At an appropriate level, the patient was placed in a slight beach chair position. Saline-moistened throat pack was placed.  Nasal vibrissae were trimmed.  Trocaine solution, 160 g total was applied on 1/2 x 3 inch cottonoids to both sides of the septal mucosa and turbinates.  Xylocaine 1% with 1:100,000 epinephrine, 8 cc total was infiltrated into the inferior turbinates for hemostasis.  Additional 1% Xylocaine with 1:100,000 epinephrine, 8 cc total was infiltrated across the soft palate and into the peritonsillar plane for intraoperative hemostasis.  Several minutes were allowed for  this to take effect.  Sterile preparation and draping of the face was accomplished.  The materials were removed from the nose and observed to be intact and correct n number.  The findings were as described above.  Beginning on the right side, the anterior hood of the inferior turbinate was lysed vertically just behind the nasal valve.  The medial mucosa of the turbinate was incised in an anterior upsloping  fashion.  A flap was elevated.  The turbinate was infractured and, working in a  posterior downsloping fashion, the turbinate bone and lateral mucosa were resected, taking virtually all the anterior pole and leaving virtually all of the posterior pole.  Additional bony spicules were carefully removed submucosally.  The posterior pole was suction coagulated for hemostasis.  The flap was laid back down, and the turbinate was outfractured, and this side was completed.  Following the right side, the left side was done in an identical fashion.  After completing both turbinates and rendering them hemostatic, a double-thickness Neosporin-impregnated Telfa pack was placed along each inferior turbinate.  A #6.5 nasal trumpet was shortened to the nasopharynx and placed one on each side of the nose between the septum and he packing to allow a postoperative airway and to assist in supporting the inferior turbinates.  This completed the nasal portion of the procedure.  The patient was at this point placed in Trendelenburg.  The pharynx was suctioned free and throat pack was removed.  Findings were as described above.  Taking care to protect lips, teeth, and endotracheal tube, the Crowe-Davis mouth gag was introduced, expanded for visualization, and suspended from the Mayo stand in the standard fashion.  Findings were as described above.  Palate retractor and mirror were used to visualize the nasopharynx which was narrow from side to side but no significant adenoids noted.  At  this point, the left tonsil was grasped and retracted medially.  The mucosa overlying the anterior and superior poles was coagulated and then cut including  part of the anterior pillar down to the capsule of the tonsil.  Working from inferiorly upward, the tonsil was dissected free of its muscular plane. Additional cautery rendered the area hemostatic.  At the time that the tonsil was suspended from its superior pole, the right tonsil was treated in the same way.  A vertical inverted V was made at the palatal dimple point on the anterior surface of the oft palate and carried down into the submucosa and muscularis of the palate.  The muscle of the uvula was amputated.  The posterior mucosa was prepared in a refashion to allow it to be rolled over the free edge of the palate and brought  forward.  The specimen was delivered en bloc both tonsils and the portions of the soft palate which was resected.  At this point, a small additional quantity of cautery in the right tonsil fossa rendered the fossa hemostatic.  The midline mucosa on the posterior surface of the soft palate was brought forward and secured into the V notch with a 3-0 Vicryl suture.  The soft palate was closed further ith the same material, working laterally to make a more rectangular configuration of the free edge of the soft palate.  There was slightly more posterior pole on the right side than the left.  This was incised in a 45 degree angle, allowing the latter aspect to be brought directly forward to cover the tonsil fossa and allowing the superior aspect to be brought laterally to generate a free edge of the soft  palate.  The wounds were closed with interrupted 3-0 Vicryl sutures.  There was  less posterior pillar on the left side, and an incomplete closure was accomplished on this side.  There was relatively prominent horizontal banding at the completion, and this was dealt with using a small relaxing  incision in the midline.  This was cauterized for hemostasis.  At this point, the mirror was brought back into the   filed.  The soft palate was observed to be quite thick, and a relatively small nd circular nasopharyngeal introitus had been developed.  However, the repair was thought to be good, consistent with the patients anatomy.  At this point, the procedure was completed.  The mouth gag was relaxed for several minutes.  Upon re-expansion, hemostasis was persistent.  At this point, the mouth gag was relaxed and removed.  The dental status and endotracheal tube were intact.  The patient was returned to anesthesia, awakened, extubated, and transferred to recovery in stable condition.  COMMENT:  A 50 year old black female with a history of snoring and poor quality of sleep with a sleep study showing a respiratory disturbance index of 65 but with  anatomic findings of very large turbinates and very large tonsils suggesting the possible utility of a surgical cure was the indication for todays procedure. Routine postoperative recovery with attention to analgesia, ______ , hydration,  observation for bleeding, and oxygen saturation, and additional  observation for  emesis or airway compromise. DD:  04/30/99 TD:  05/01/99 Job: [id number]

## 2010-07-23 NOTE — H&P (Signed)
   [name], [name]                           ACCOUNT NO.:  [id number]   MEDICAL RECORD NO.:  [id number]                   PATIENT TYPE:  AMB   LOCATION:  DAY                                  FACILITY:  [facility]   PHYSICIAN:  Jerolyn Shin C. Katrinka Blazing, M.D.                DATE OF BIRTH:  1960/07/02   DATE OF ADMISSION:  DATE OF DISCHARGE:                                HISTORY & PHYSICAL   HISTORY OF PRESENT ILLNESS:  A 50 year old female with history of  postprandial abdominal pain, nausea, bloating.  She has constant nausea.  Upon evaluation, ultrasound was negative but HIDA scan was strongly positive  for biliary dyskinesia.  The patient is scheduled for laparoscopic  cholecystectomy.   PAST MEDICAL HISTORY:  She has hypertension, seasonal allergies,  osteoarthritis, and obesity.   MEDICATIONS:  Avalide 300/12.5 mg q.d., Norvasc 10 mg q.d., Allegra 180 mg  q.d., Bextra 20 mg q.d.   ALLERGIES:  She has no known drug allergies.   PAST SURGICAL HISTORY:  Cecal resection.   PHYSICAL EXAMINATION:  VITAL SIGNS:  Blood pressure 140/90, pulse 88,  respirations 20, weight 222 pounds.  HEENT:  Unremarkable.  NECK:  Supple without JVD or bruit.  CHEST:  Clear to auscultation.  CARDIAC:  Regular rate and rhythm, without murmur, gallop, or rub.  ABDOMEN:  Mild epigastric and right upper quadrant pain, normal bowel  sounds.  EXTREMITIES:  No clubbing, cyanosis, or edema.  NEUROLOGIC:  Nonfocal.   IMPRESSION:  1. Chronic cholecystitis.  2. Hypertension.  3. Morbid obesity.   PLAN:  Laparoscopic cholecystectomy.                                                Dirk Dress. Katrinka Blazing, M.D.    LCS/MEDQ  D:  01/17/2002  T:  01/18/2002  Job:  [id number]

## 2010-07-26 ENCOUNTER — Other Ambulatory Visit: Payer: Self-pay | Admitting: Family Medicine

## 2010-07-26 DIAGNOSIS — Z1211 Encounter for screening for malignant neoplasm of colon: Secondary | ICD-10-CM

## 2010-07-26 NOTE — Telephone Encounter (Signed)
pls advise she is being referred , i will enter in referral box

## 2010-07-27 ENCOUNTER — Telehealth: Payer: Self-pay

## 2010-07-27 NOTE — Telephone Encounter (Signed)
Referral sent for screening colonoscopy. Pt last had colonoscopy on 06/29/2010 by Dr/ Rourk Normal. Previous hx of colonic polyps. Was told next due in 5 years. Pt says she is not having any problems. Please advise

## 2010-07-29 NOTE — Telephone Encounter (Signed)
CORRRECTION: PT'S LAST COLONOSCOPY WAS 06/29/2006.

## 2010-07-29 NOTE — Telephone Encounter (Signed)
Next tcs appears to be due 2017

## 2010-07-29 NOTE — Telephone Encounter (Signed)
Sent to [employer]

## 2010-07-30 NOTE — Telephone Encounter (Signed)
LMOM for pt to call. 

## 2010-07-30 NOTE — Telephone Encounter (Signed)
Per Dr. Jena Gauss, pt due in 5 years from last colonoscopy which was done in April of 2008, making her next due in April of 2013 if she is not having any problems. Pt was informed and says she is not having any problems at this time.

## 2010-09-09 ENCOUNTER — Encounter: Payer: Self-pay | Admitting: Family Medicine

## 2010-09-20 ENCOUNTER — Ambulatory Visit: Payer: Self-pay | Admitting: Family Medicine

## 2010-10-22 ENCOUNTER — Encounter: Payer: Self-pay | Admitting: Family Medicine

## 2010-10-25 ENCOUNTER — Ambulatory Visit: Payer: Self-pay | Admitting: Family Medicine

## 2010-11-30 LAB — POCT I-STAT, CHEM 8
BUN: 10
Calcium, Ion: 1.1 — ABNORMAL LOW
Chloride: 100
Creatinine, Ser: 1
Glucose, Bld: 361 — ABNORMAL HIGH
HCT: 37
Hemoglobin: 12.6
Potassium: 3.5
Sodium: 136
TCO2: 25

## 2014-11-02 ENCOUNTER — Encounter: Payer: Self-pay | Admitting: Emergency Medicine

## 2014-11-02 ENCOUNTER — Emergency Department
Admission: EM | Admit: 2014-11-02 | Discharge: 2014-11-02 | Disposition: A | Discharge status: Home / Self Care | Payer: Managed Care, Other (non HMO) | Attending: Emergency Medicine | Admitting: Emergency Medicine

## 2014-11-02 ENCOUNTER — Other Ambulatory Visit: Payer: Self-pay

## 2014-11-02 DIAGNOSIS — Z794 Long term (current) use of insulin: Secondary | ICD-10-CM | POA: Insufficient documentation

## 2014-11-02 DIAGNOSIS — I10 Essential (primary) hypertension: Secondary | ICD-10-CM | POA: Insufficient documentation

## 2014-11-02 DIAGNOSIS — E1140 Type 2 diabetes mellitus with diabetic neuropathy, unspecified: Secondary | ICD-10-CM | POA: Insufficient documentation

## 2014-11-02 LAB — COMPREHENSIVE METABOLIC PANEL
ALT: 16 U/L (ref 14–54)
AST: 20 U/L (ref 15–41)
Albumin: 3.8 g/dL (ref 3.5–5.0)
Alkaline Phosphatase: 79 U/L (ref 38–126)
Anion gap: 9 (ref 5–15)
BUN: 10 mg/dL (ref 6–20)
CO2: 25 mmol/L (ref 22–32)
Calcium: 8.9 mg/dL (ref 8.9–10.3)
Chloride: 103 mmol/L (ref 101–111)
Creatinine, Ser: 0.5 mg/dL (ref 0.44–1.00)
GFR calc Af Amer: >60 mL/min (ref >60)
GFR calc non Af Amer: >60 mL/min (ref >60)
Glucose, Bld: 196 mg/dL — ABNORMAL HIGH (ref 65–99)
Potassium: 3.7 mmol/L (ref 3.5–5.1)
Sodium: 137 mmol/L (ref 135–145)
Total Bilirubin: 0.8 mg/dL (ref 0.3–1.2)
Total Protein: 7.9 g/dL (ref 6.5–8.1)

## 2014-11-02 LAB — CBC WITH DIFFERENTIAL/PLATELET
Basophils Absolute: 0 10*3/uL (ref 0–0.1)
Basophils Relative: 0 %
Eosinophils Absolute: 0.1 10*3/uL (ref 0–0.7)
Eosinophils Relative: 1 %
HCT: 39.9 % (ref 35.0–47.0)
Hemoglobin: 12.9 g/dL (ref 12.0–16.0)
Lymphocytes Relative: 53 %
Lymphs Abs: 3.8 10*3/uL — ABNORMAL HIGH (ref 1.0–3.6)
MCH: 25.9 pg — ABNORMAL LOW (ref 26.0–34.0)
MCHC: 32.4 g/dL (ref 32.0–36.0)
MCV: 80 fL (ref 80.0–100.0)
Monocytes Absolute: 0.4 10*3/uL (ref 0.2–0.9)
Monocytes Relative: 6 %
Neutro Abs: 2.9 10*3/uL (ref 1.4–6.5)
Neutrophils Relative %: 40 %
Platelets: 310 10*3/uL (ref 150–440)
RBC: 4.98 MIL/uL (ref 3.80–5.20)
RDW: 15.1 % — ABNORMAL HIGH (ref 11.5–14.5)
WBC: 7.3 10*3/uL (ref 3.6–11.0)

## 2014-11-02 LAB — TROPONIN I: Troponin I: <0.03 ng/mL (ref <0.031)

## 2014-11-02 MED ORDER — HYDRALAZINE HCL 50 MG PO TABS: 50.0000 mg | ORAL_TABLET | Freq: Three times a day (TID) | ORAL | Status: AC

## 2014-11-02 NOTE — ED Provider Notes (Signed)
[facility] Emergency Department Provider Note     Time seen: ----------------------------------------- 12:03 PM on 11/02/2014 -----------------------------------------    I have reviewed the triage vital signs and the nursing notes.   HISTORY  Chief Complaint Hypertension    HPI Michelle [name] is a 54 y.o. female who presents ER for high blood pressure the last week. Patient denies any other complaints other than a mild headache. Patient states usually it elevated in the morning and then throughout the day for blood pressure seems to come down. She states has been running high like this into the 200s all week. At the time my evaluation blood pressure is 157/95.   Past Medical History  Diagnosis Date  . Hyperlipemia   . Obesity   . Diabetes mellitus   . Hypertension     Patient Active Problem List   Diagnosis Date Noted  . Hypertension goal BP (blood pressure) < 130/80 07/11/2010  . Hyperlipidemia LDL goal < 70 07/11/2010  . IDDM (insulin dependent diabetes mellitus) 07/06/2010  . BACK PAIN WITH RADICULOPATHY 12/07/2009  . TINEA CRURIS 08/11/2009  . 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 Other malaise and fatigue 06/05/2009  . CHEST PAIN, ATYPICAL 06/05/2009  . LEG PAIN, LEFT 04/02/2008  . DM 09/28/2007  . OBESITY 09/28/2007    Past Surgical History  Procedure Laterality Date  . Cholecystectomy    . Tonsilectomy, adenoidectomy, bilateral myringotomy and tubes    . Btl    . Partial recection to colonsecondary to polyps      Allergies Review of patient's allergies indicates no known allergies.  Social History Social History  Substance Use Topics  . Smoking status: Never Smoker   . Smokeless tobacco: None  . Alcohol Use: No    Review of Systems Constitutional: Negative for fever. Eyes: Negative for visual changes. ENT: Negative for sore throat. Cardiovascular: Negative for chest pain. Respiratory: Negative for  shortness of breath. Gastrointestinal: Negative for abdominal pain, vomiting and diarrhea. Genitourinary: Negative for dysuria. Musculoskeletal: Negative for back pain. Skin: Negative for rash. Neurological: Positive for headache, negative for focal weakness or numbness.  10-point ROS otherwise negative.  ____________________________________________   PHYSICAL EXAM:  VITAL SIGNS: ED Triage Vitals  Enc Vitals Group     BP 11/02/14 1111 176/102 mmHg     Pulse Rate 11/02/14 1111 108     Resp 11/02/14 1111 20     Temp 11/02/14 1111 98.5 F (36.9 C)     Temp Source 11/02/14 1111 Oral     SpO2 11/02/14 1111 94 %     Weight 11/02/14 1111 236 lb (107.049 kg)     Height 11/02/14 1111  (1.626 m)     Head Cir --      Peak Flow --      Pain Score 11/02/14 1112 7     Pain Loc --      Pain Edu? --      Excl. in GC? --     Constitutional: Alert and oriented. Well appearing and in no distress. Eyes: Conjunctivae are normal. PERRL. Normal extraocular movements. ENT   Head: Normocephalic and atraumatic.   Nose: No congestion/rhinnorhea.   Mouth/Throat: Mucous membranes are moist.   Neck: No stridor. Cardiovascular: Normal rate, regular rhythm. Normal and symmetric distal pulses are present in all extremities. No murmurs, rubs, or gallops. Respiratory: Normal respiratory effort without tachypnea nor retractions. Breath sounds are clear and equal bilaterally. No wheezes/rales/rhonchi. Gastrointestinal: Soft and nontender. No distention.  No abdominal bruits.  Musculoskeletal: Nontender with normal range of motion in all extremities. No joint effusions.  No lower extremity tenderness nor edema. Neurologic:  Normal speech and language. No gross focal neurologic deficits are appreciated. Speech is normal. No gait instability. Skin:  Skin is warm, dry and intact. No rash noted. Psychiatric: Mood and affect are normal. Speech and behavior are normal. Patient exhibits appropriate  insight and judgment. ____________________________________________  EKG: Interpreted by me. Normal sinus rhythm with a rate of 95 bpm, normal PR interval, normal QRS width, borderline prolonged QT.  ____________________________________________  ED COURSE:  Pertinent labs & imaging results that were available during my care of the patient were reviewed by me and considered in my medical decision making (see chart for details). Patient is in no acute distress, will check basic labs and reevaluate. ____________________________________________    LABS (pertinent positives/negatives)  Labs Reviewed  COMPREHENSIVE METABOLIC PANEL - Abnormal; Notable for the following:    Glucose, Bld 196 (*)    All other components within normal limits  CBC WITH DIFFERENTIAL/PLATELET - Abnormal; Notable for the following:    MCH 25.9 (*)    RDW 15.1 (*)    Lymphs Abs 3.8 (*)    All other components within normal limits  URINALYSIS COMPLETEWITH MICROSCOPIC ([facility] ONLY)   ___________________________________________  FINAL ASSESSMENT AND PLAN  Hypertension  Plan: Patient with labs and imaging as dictated above. Patient looks well, labs are grossly unremarkable. Will increase her hydralazine to 50 mg 3 times a day, advised follow-up with her doctor and keeping a journal of her blood pressure for the next week.   Emily Filbert, MD   Emily Filbert, MD 11/02/14 [phone]

## 2014-11-02 NOTE — Discharge Instructions (Signed)

## 2014-11-02 NOTE — ED Notes (Signed)
High blood pressure for the past week. Denies blurred vision or slurring of speech.  Currently on BP medications and has been compliant. No cardiologist.  Has had a cardiac cath done 3 years ago.

## 2014-11-02 NOTE — ED Notes (Signed)
Called Lab re: Troponin. Still processing at this time.

## 2014-11-02 NOTE — ED Notes (Signed)
States her blood pressure has been up all week not feeling well.headache

## 2015-10-18 ENCOUNTER — Emergency Department: Payer: Managed Care, Other (non HMO)

## 2015-10-18 ENCOUNTER — Emergency Department
Admission: EM | Admit: 2015-10-18 | Discharge: 2015-10-18 | Payer: Managed Care, Other (non HMO) | Attending: Emergency Medicine | Admitting: Emergency Medicine

## 2015-10-18 ENCOUNTER — Encounter: Payer: Self-pay | Admitting: Emergency Medicine

## 2015-10-18 DIAGNOSIS — I10 Essential (primary) hypertension: Secondary | ICD-10-CM | POA: Insufficient documentation

## 2015-10-18 DIAGNOSIS — Z79899 Other long term (current) drug therapy: Secondary | ICD-10-CM | POA: Insufficient documentation

## 2015-10-18 DIAGNOSIS — E119 Type 2 diabetes mellitus without complications: Secondary | ICD-10-CM | POA: Diagnosis not present

## 2015-10-18 DIAGNOSIS — R29898 Other symptoms and signs involving the musculoskeletal system: Secondary | ICD-10-CM

## 2015-10-18 DIAGNOSIS — R200 Anesthesia of skin: Secondary | ICD-10-CM

## 2015-10-18 DIAGNOSIS — R270 Ataxia, unspecified: Secondary | ICD-10-CM | POA: Diagnosis not present

## 2015-10-18 DIAGNOSIS — Z7984 Long term (current) use of oral hypoglycemic drugs: Secondary | ICD-10-CM | POA: Diagnosis not present

## 2015-10-18 DIAGNOSIS — R531 Weakness: Secondary | ICD-10-CM | POA: Diagnosis present

## 2015-10-18 DIAGNOSIS — Z794 Long term (current) use of insulin: Secondary | ICD-10-CM | POA: Diagnosis not present

## 2015-10-18 LAB — BASIC METABOLIC PANEL
Anion gap: 5 (ref 5–15)
BUN: 12 mg/dL (ref 6–20)
CO2: 28 mmol/L (ref 22–32)
Calcium: 8.8 mg/dL — ABNORMAL LOW (ref 8.9–10.3)
Chloride: 106 mmol/L (ref 101–111)
Creatinine, Ser: 0.54 mg/dL (ref 0.44–1.00)
GFR calc Af Amer: >60 mL/min (ref >60)
GFR calc non Af Amer: >60 mL/min (ref >60)
Glucose, Bld: 149 mg/dL — ABNORMAL HIGH (ref 65–99)
Potassium: 3.9 mmol/L (ref 3.5–5.1)
Sodium: 139 mmol/L (ref 135–145)

## 2015-10-18 LAB — CBC
HCT: 40.5 % (ref 35.0–47.0)
Hemoglobin: 13.1 g/dL (ref 12.0–16.0)
MCH: 26.2 pg (ref 26.0–34.0)
MCHC: 32.4 g/dL (ref 32.0–36.0)
MCV: 80.8 fL (ref 80.0–100.0)
Platelets: 313 10*3/uL (ref 150–440)
RBC: 5.02 MIL/uL (ref 3.80–5.20)
RDW: 14.8 % — ABNORMAL HIGH (ref 11.5–14.5)
WBC: 7.8 10*3/uL (ref 3.6–11.0)

## 2015-10-18 LAB — GLUCOSE, CAPILLARY: Glucose-Capillary: 126 mg/dL — ABNORMAL HIGH (ref 65–99)

## 2015-10-18 MED ORDER — ALTEPLASE (STROKE) FULL DOSE INFUSION
90.0000 mg | Freq: Once | INTRAVENOUS | Status: AC
  Administered 2015-10-18: 90 mg via INTRAVENOUS

## 2015-10-18 MED ORDER — LABETALOL HCL 5 MG/ML IV SOLN: 20.0000 mg | Freq: Once | INTRAVENOUS | Status: DC

## 2015-10-18 MED ORDER — IOPAMIDOL (ISOVUE-370) INJECTION 76%
75.0000 mL | Freq: Once | INTRAVENOUS | Status: AC | PRN
  Administered 2015-10-18: 75 mL via INTRAVENOUS

## 2015-10-18 MED ORDER — LABETALOL HCL 5 MG/ML IV SOLN
10.0000 mg | Freq: Once | INTRAVENOUS | Status: AC
  Administered 2015-10-18: 10 mg via INTRAVENOUS

## 2015-10-18 MED ORDER — SODIUM CHLORIDE 0.9 % IV SOLN
50.0000 mL | Freq: Once | INTRAVENOUS | Status: AC
  Administered 2015-10-18: 50 mL via INTRAVENOUS

## 2015-10-18 NOTE — ED Notes (Signed)
Pt states she is unable to use bedpan at this time.

## 2015-10-18 NOTE — ED Notes (Addendum)
Pt noted have some slight bleeding noted to L sided incisor tooth. Pt denies tasting any blood. MD notified of bleeding.

## 2015-10-18 NOTE — ED Notes (Signed)
[facility] accepted pt

## 2015-10-18 NOTE — ED Notes (Signed)
Vikki PortsValerie, RN at bedside for assistance with code stroke.

## 2015-10-18 NOTE — ED Notes (Signed)
[facility]. Pt in stable condition upon her departure with [facility].

## 2015-10-18 NOTE — ED Notes (Signed)
[facility]

## 2015-10-18 NOTE — ED Notes (Signed)
No bleeding noted at this time. Pt tolerating TPA well. Will continue to monitor for further patient needs.

## 2015-10-18 NOTE — ED Notes (Signed)
No bleeding noted at this time. Pt tolerating TPA well. Denies any needs at this time. Will continue to monitor for further patient needs.

## 2015-10-18 NOTE — ED Notes (Signed)
No bleeding noted at this time. Pt states sensory deficits improving, numbness is beginning to resolve to L side. Pt also noted to have decreasing in drift to L arm and L leg.

## 2015-10-18 NOTE — ED Notes (Signed)
[facility] [place]  [phone]

## 2015-10-18 NOTE — ED Notes (Signed)
No bleeding noted at this time. Pt passed stroke swallow screen at this time. Will continue to monitor for further patient needs. Pt continues to improve.

## 2015-10-18 NOTE — ED Triage Notes (Addendum)
Pt pulled from POV w/ c/o weakness.  Pt unable to move extremities to get out of vehicle.  Pt states she wasn't feeling well during church and sat out in the car with the windows down.  Pt c/o headache that started during church around 11.  Pt states she also had some dizziness as well. Denies any dizziness at this time.

## 2015-10-18 NOTE — ED Notes (Signed)
MD at bedside at this time. Recycling pt's BP at this time due to pt having BP >185.

## 2015-10-18 NOTE — ED Notes (Signed)
[facility] [place]  [address] and to [address], [place], [place]

## 2015-10-18 NOTE — ED Notes (Signed)
Pt placed on bedpan at this time. Pt's daughter at bedside at this time.

## 2015-10-18 NOTE — ED Provider Notes (Signed)
[facility] Emergency Department Provider Note ____________________________________________   I have reviewed the triage vital signs and the triage nursing note.  HISTORY  Chief Complaint Weakness   Historian Patient and daughter  HPI Derry SkillSandra D Howson is a 55 y.o. female with a history of obesity, hypertension, hyperlipidemia, and diabetes, without prior known coronary artery disease before prior stroke or headache syndromes, presents today from out of town. [place] and was attending church with her daughter. This morning when she woke up she had a headache which gradually got worse and worse. She was started to feel lightheaded and dizzy while in church and around 12:15 when out her daughter's car and started to feel left arm and left face and left leg tingling/numbness. She is still complaining of a moderate global headache. Denies vision changes. When she got to her daughter's car she felt like she could not move both arms or both legs. Over a period of time she was able to bend her leg some and move her arm some.  She's never had an episode just like this, but she had something somewhat similar with a headache and some generalized weakness all over a couple of years ago, she states that she did not have any head imaging and never was really diagnosed with the episode was. She does carry a diagnosis of migraines.  She does have high blood pressure, and states that she takes her medication for this.    Past Medical History:  Diagnosis Date  . Diabetes mellitus   . Hyperlipemia   . Hypertension   . Obesity     Patient Active Problem List   Diagnosis Date Noted  . Hypertension goal BP (blood pressure) < 130/80 07/11/2010  . Hyperlipidemia LDL goal < 70 07/11/2010  . IDDM (insulin dependent diabetes mellitus) (HCC) 07/06/2010  . BACK PAIN WITH RADICULOPATHY 12/07/2009  . TINEA CRURIS 08/11/2009  . DIABETIC PERIPHERAL NEUROPATHY  07/10/2009  . Other malaise and fatigue 06/05/2009  . CHEST PAIN, ATYPICAL 06/05/2009  . LEG PAIN, LEFT 04/02/2008  . DM 09/28/2007  . OBESITY 09/28/2007    Past Surgical History:  Procedure Laterality Date  . BTL    . CHOLECYSTECTOMY    . partial recection to colonsecondary to polyps    . TONSILECTOMY, ADENOIDECTOMY, BILATERAL MYRINGOTOMY AND TUBES      Prior to Admission medications   Medication Sig Start Date End Date Taking? Authorizing Provider  valsartan (DIOVAN) 320 MG tablet Take 320 mg by mouth daily. 10/13/15  Yes Historical Provider, MD  aliskiren (TEKTURNA) 300 MG tablet Take 300 mg by mouth daily.    Historical Provider, MD  amLODipine (NORVASC) 5 MG tablet Take 10 mg by mouth daily.    Historical Provider, MD  cloNIDine (CATAPRES) 0.2 MG tablet Take 1 tablet (0.2 mg total) by mouth at bedtime. 07/06/10 07/06/11  Kerri PerchesMargaret E Simpson, MD  hydrALAZINE (APRESOLINE) 50 MG tablet Take 1 tablet (50 mg total) by mouth 3 (three) times daily. 11/02/14   Emily FilbertJonathan E Williams, MD  insulin aspart (NOVOLOG) 100 UNIT/ML injection Inject 20 Units into the skin 3 (three) times daily with meals.    Historical Provider, MD  insulin detemir (LEVEMIR FLEXPEN) 100 UNIT/ML injection Inject into the skin at bedtime. Sliding scale    Historical Provider, MD  Insulin Syringe-Needle U-100 (B-D INS SYR ULTRAFINE 1CC/30G) 30G X 1/2" 1 ML MISC by Does not apply route. Once daily use with insulin  Historical Provider, MD  lisinopril-hydrochlorothiazide (PRINZIDE,ZESTORETIC) 20-12.5 MG per tablet Take 2 tablets by mouth daily. 07/06/10   Kerri Perches, MD  metFORMIN (GLUCOPHAGE) 1000 MG tablet Take 1,000 mg by mouth 2 (two) times daily with a meal.    Historical Provider, MD  rosuvastatin (CRESTOR) 10 MG tablet Take 1 tablet (10 mg total) by mouth at bedtime. 07/06/10 07/06/11  Kerri Perches, MD  Saxagliptin-Metformin (KOMBIGLYZE XR) 2.07-998 MG TB24 Take 2 tablets by mouth daily. 07/06/10   Kerri Perches, MD    No Known Allergies  Family History  Problem Relation Age of Onset  . Heart attack Father   . Hypertension Father   . Diabetes Father   . Stroke Father   . Heart disease Father   . Hypertension Mother   . Obesity Mother   . Heart disease Mother   . Leukemia Brother   . Diabetes Sister   . Hypertension Sister   . Diabetes Sister   . Hypertension Sister   . Asthma Sister   . Hypertension Sister     Social History Social History  Substance Use Topics  . Smoking status: Never Smoker  . Smokeless tobacco: Never Used  . Alcohol use No    Review of Systems  Constitutional: Negative for Recent illness. Eyes: Negative for visual changes. ENT: Negative for sore throat. Cardiovascular: Negative for chest pain. Respiratory: Negative for shortness of breath. Gastrointestinal: Negative for abdominal pain, vomiting and diarrhea. Genitourinary: Negative for dysuria. Musculoskeletal: Negative for back pain. Skin: Negative for rash. Neurological: Positive for headache. 10 point Review of Systems otherwise negative ____________________________________________   PHYSICAL EXAM:  VITAL SIGNS: ED Triage Vitals  Enc Vitals Group     BP 10/18/15 1302 (!) 175/93     Pulse Rate 10/18/15 1302 (!) 103     Resp 10/18/15 1302 20     Temp 10/18/15 1302 98.5 F (36.9 C)     Temp Source 10/18/15 1302 Oral     SpO2 10/18/15 1302 100 %     Weight 10/18/15 1302 236 lb (107 kg)     Height 10/18/15 1302 5\' 4"  (1.626 m)     Head Circumference --      Peak Flow --      Pain Score 10/18/15 1301 8     Pain Loc --      Pain Edu? --      Excl. in GC? --      Constitutional: Alert and oriented, cooperative, but somewhat slow to speak and answer questions. Wincing at times due to headache. HEENT   Head: Normocephalic and atraumatic.      Eyes: Conjunctivae are normal. PERRL. Normal extraocular movements.      Ears:         Nose: No congestion/rhinnorhea.    Mouth/Throat: Mucous membranes are moist.   Neck: No stridor. Cardiovascular/Chest: Normal rate, regular rhythm.  No murmurs, rubs, or gallops. Respiratory: Normal respiratory effort without tachypnea nor retractions. Breath sounds are clear and equal bilaterally. No wheezes/rales/rhonchi. Gastrointestinal: Soft. No distention, no guarding, no rebound. Nontender.  Obese  Genitourinary/rectal:Deferred Musculoskeletal: Nontender with normal range of motion in all extremities. No joint effusions.  No lower extremity tenderness.  No edema. Neurologic:  No facial droop. Left face paresthesia in V1, V2 and V3. Normal tongue protrusion. No dysarthria. No aphasia. Generalized weakness of bilateral lower extremities, patient is essentially unable to hold either leg up above the gurney. When the patient brings both arms up  to parallel, left arm unable to hold up more than 2-3 seconds.  Left arm and left leg paresthesia. Finger to nose coordination seems intact on the right although somewhat limited by patient having some generalized weakness, but finger to nose on the left was significantly ataxic. Gait was not tested. Skin:  Skin is warm, dry and intact. No rash noted. Psychiatric: Mood and affect are normal. Speech and behavior are normal. Patient exhibits appropriate insight and judgment.  ____________________________________________   EKG I, Governor Rooks, MD, the attending physician have personally viewed and interpreted all ECGs.  102 bpm. Sinus tachycardia. Narrow distress. Normal axis. LVH. Nonspecific ST and T-wave ____________________________________________  LABS (pertinent positives/negatives)  Labs Reviewed  BASIC METABOLIC PANEL - Abnormal; Notable for the following:       Result Value   Glucose, Bld 149 (*)    Calcium 8.8 (*)    All other components within normal limits  CBC - Abnormal; Notable for the following:    RDW 14.8 (*)    All other components within normal limits   GLUCOSE, CAPILLARY - Abnormal; Notable for the following:    Glucose-Capillary 126 (*)    All other components within normal limits  URINALYSIS COMPLETEWITH MICROSCOPIC ([facility] ONLY)  ETHANOL  PROTIME-INR  APTT  CBC  DIFFERENTIAL  COMPREHENSIVE METABOLIC PANEL  URINE RAPID DRUG SCREEN, HOSP PERFORMED  URINALYSIS COMPLETEWITH MICROSCOPIC ([facility] ONLY)  CBG MONITORING, ED  I-STAT CHEM 8, ED  I-STAT TROPOININ, ED    ____________________________________________  RADIOLOGY All Xrays were viewed by me. Imaging interpreted by Radiologist.  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  __________________________________________  PROCEDURES  Procedure(s) performed: None  Critical Care performed: CRITICAL CARE Performed by: Governor Rooks   Total critical care time: 70 minutes  Critical care time was exclusive of separately billable procedures and treating other patients.  Critical care was necessary to treat or prevent imminent or life-threatening deterioration.  Critical care was time spent personally by me on the following activities: development of treatment plan with patient and/or surrogate as well as nursing, discussions with consultants, evaluation of patient's response to treatment, examination of patient, obtaining history from patient or surrogate, ordering and performing treatments and interventions, ordering and review of laboratory studies, ordering and review of radiographic studies, pulse oximetry and re-evaluation of patient's condition.   ____________________________________________   ED COURSE / ASSESSMENT AND PLAN  Pertinent labs & imaging results that were  available during my care of the patient were reviewed by me and considered in my medical decision making (see chart for details).   This patient came in with a complaint of headache, and the nurse let me know that the patient was having some left facial numbness, when I went to evaluate the patient, not only was she having left-sided facial numbness but also left arm and left leg tingling/numbness as well. Her initial exam showed generalized weakness of all 4 extremities, no apparent difference in the weakness all over and she was having trouble cooperating with the exam saying that she just felt too weak.  When tele-neurologist consult was performing his evaluation, I did come back to the bedside and at this point in time the patient seemed to have more strength in her lower extremities  and right arm, and it was clear that her left arm was actually significantly different in terms of strength and coordination than the right side. NIH stroke scale at this point is between 5 and 6, and the neurologist did recommend TPA given window.  Risks and benefits were discussed by the neurologist and myself with the patient, and she and her daughter understood and agreed/consented for treatment with TPA.  Upon decision to treat with TPA, patient's blood pressure was elevated just above range at 200/100. Patient was given 10 mg of IV labetalol 2 with resultant improvement of blood pressure to 170s over 80s and TPA bolus was initiated.  No immediate complications. No immediate improvement.  Tele-neurologist recommended immediate CT angiogram head and neck during TPA treatment.  I spoke with the patient and her daughter about needing to be transferred for admission to replace with 24 7 neurology and neurosurgical backup in case of potential hemorrhage complication. They strongly preferred [facility] because this patient is from [place], and the daughter lives near [facility]  One hour after initial  consultation with the transfer center, still awaiting callback from neurology. Apparently there is no ED to ED acceptance or auto acceptance for post TPA patients at [facility].  3:20 PM, patient has improved movement in 4 extremities in terms of strength, and especially in the left arm she is now able to hold it up against gravity. With finger to nose testing on the left, she still has some ataxia, but improved from before TPA.  Again no complications. Her blood pressure has come down to about the 130-150/70-90s without any new additional treatments.  3:40 PM, took a phone call from the transfer center, states that their neurologist hit their quota for post-tpa and cannot accommodate this patient at this time.  I discussed with patient and daughter option to go to [facility] vs. [facility], and patient/daughter prefer [facility].  [facility] neurologist accepts in transfer.  CONSULTATIONS:   Specialist on-call tele-neurologist, recommends tPA.  [facility] transfer center declines.  [facility] Neurologist Dr. Regino Schultze accepts in transfer to neuro icu.   Patient / Family / Caregiver informed of clinical course, medical decision-making process, and agree with plan.   ___________________________________________   FINAL CLINICAL IMPRESSION(S) / ED DIAGNOSES   Final diagnoses:  Left sided numbness  Left arm weakness  Ataxia              Note: This dictation was prepared with Dragon dictation. Any transcriptional errors that result from this process are unintentional    Governor Rooks, MD 10/18/15 1551

## 2015-10-18 NOTE — ED Notes (Signed)
MD notified of patient in room, that patient was having headache, generalized weakness, and dizziness. Per MD no code stroke at this time.

## 2015-10-18 NOTE — ED Notes (Signed)
[facility]

## 2015-10-18 NOTE — ED Notes (Signed)
Recalled [facility] 1505, 1515, and 1523 to inquire when Dr. Shaune PollackLord would hear from Neurology, informed they were working on getting Neurology

## 2017-07-06 IMAGING — CT CT ANGIO NECK
1 of 8 series · 6 of 33 positions shown · IV contrast (APPLIED)
Comparison: CT head 10/18/2015

CLINICAL DATA: Left-sided numbness.  Post tPA

EXAM:
CT ANGIOGRAPHY HEAD AND NECK
TECHNIQUE: Multidetector CT imaging of the head and neck was performed using
the standard protocol during bolus administration of intravenous
contrast. Multiplanar CT image reconstructions and MIPs were
obtained to evaluate the vascular anatomy. Carotid stenosis
measurements (when applicable) are obtained utilizing NASCET
criteria, using the distal internal carotid diameter as the
denominator.
CONTRAST:  75 mL Isovue 370 IV

[Series 6: ax thin · axial · 0.39mm/px · z∈[-287,-66]mm · 6 of 323 slices shown]
[im 47/323  soft-tissue]
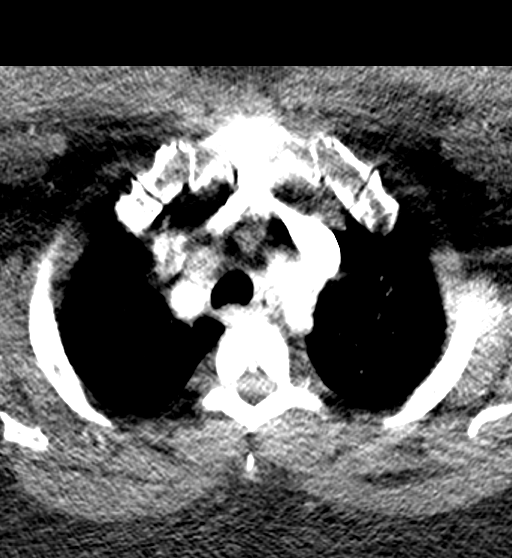
[im 93/323  bone]
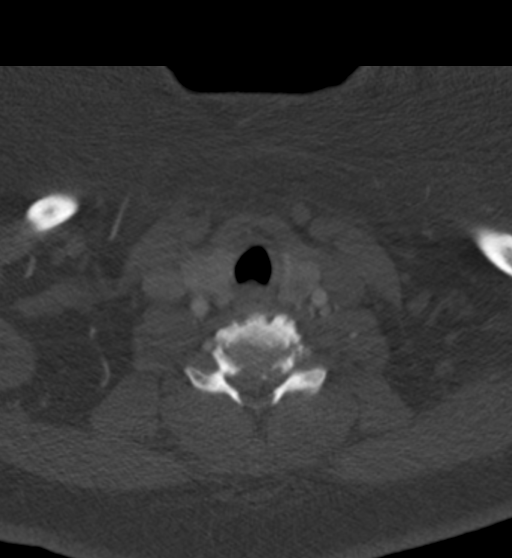
[im 139/323  soft-tissue]
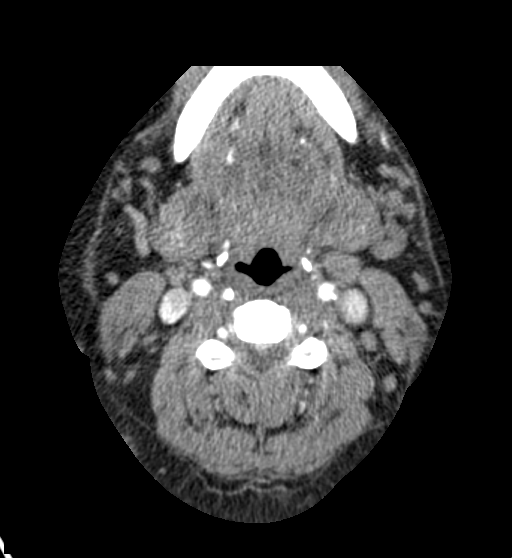
[im 185/323  bone]
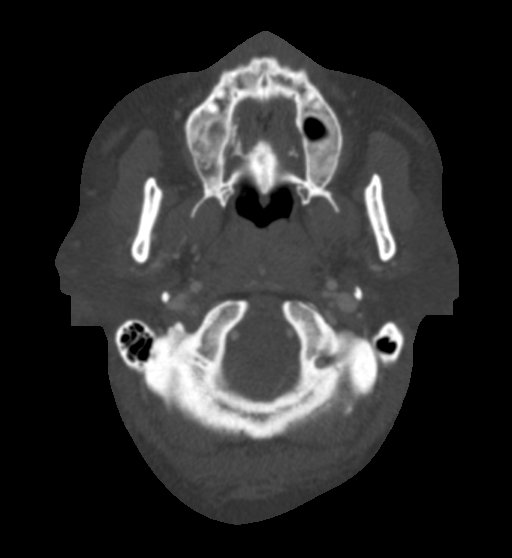
[im 231/323  soft-tissue]
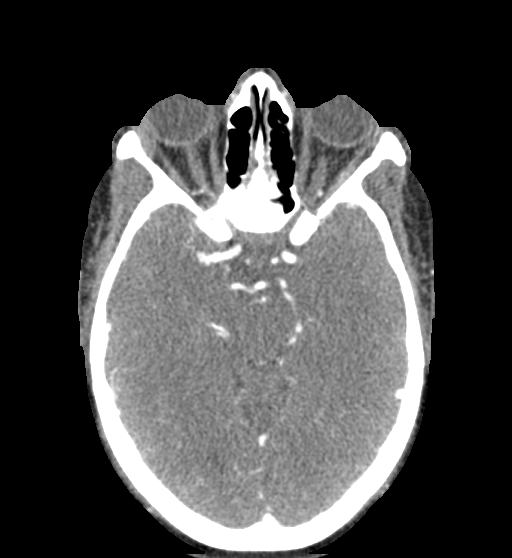
[im 277/323  bone]
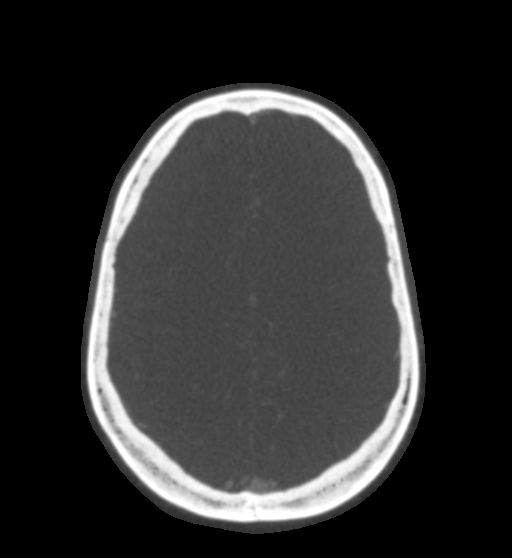

[6 of 33 positions shown; findings below may reference images not displayed]

FINDINGS: CTA NECK

Aortic arch: Minimal atherosclerotic disease in the aortic arch.
Proximal great vessels obscured by artifact but appear patent.

Right carotid system: Right common carotid artery widely patent.
Minimal atherosclerotic calcification right carotid bulb without
carotid stenosis

Left carotid system: Left common carotid artery widely patent. Mild
atherosclerotic calcification left carotid bulb without significant
stenosis

Vertebral arteries:Both vertebral arteries patent to the basilar
without stenosis.

Skeleton: Ossification of the posterior longitudinal ligament from
C2-3 through C6-7. This is causing severe spinal stenosis and cord
compression. No acute skeletal abnormality. Poor dentition.

Other neck: Left thyroid nodule not well evaluated due to artifact

Upper chest: Lung apices clear without mass or infiltrate.

CTA HEAD

Anterior circulation: Mild atherosclerotic disease in the cavernous
carotid bilaterally without significant stenosis. Anterior and
middle cerebral arteries patent bilaterally without stenosis.
Negative for cerebral aneurysm.

Posterior circulation: Both vertebral arteries patent to the
basilar. PICA patent bilaterally. Basilar widely patent. AICA,
superior cerebellar, posterior cerebral arteries patent bilaterally
without stenosis.

Venous sinuses: Patent

Anatomic variants: None

Delayed phase: Normal enhancement on delayed imaging. No enhancing
mass lesion. Negative for intracranial hemorrhage.
IMPRESSION: Minimal atherosclerotic disease

No significant carotid or vertebral artery stenosis in the neck

Negative CTA head

Ossification posterior longitudinal ligament. Multilevel severe
spinal stenosis and cord compression.

## 2017-10-29 ENCOUNTER — Emergency Department: Payer: Managed Care, Other (non HMO)

## 2017-10-29 ENCOUNTER — Encounter: Payer: Self-pay | Admitting: Emergency Medicine

## 2017-10-29 ENCOUNTER — Emergency Department
Admission: EM | Admit: 2017-10-29 | Discharge: 2017-10-29 | Disposition: A | Discharge status: Home / Self Care | Payer: Managed Care, Other (non HMO) | Attending: Emergency Medicine | Admitting: Emergency Medicine

## 2017-10-29 DIAGNOSIS — R531 Weakness: Secondary | ICD-10-CM | POA: Diagnosis present

## 2017-10-29 DIAGNOSIS — Z794 Long term (current) use of insulin: Secondary | ICD-10-CM | POA: Diagnosis not present

## 2017-10-29 DIAGNOSIS — N281 Cyst of kidney, acquired: Secondary | ICD-10-CM | POA: Insufficient documentation

## 2017-10-29 DIAGNOSIS — E119 Type 2 diabetes mellitus without complications: Secondary | ICD-10-CM | POA: Insufficient documentation

## 2017-10-29 DIAGNOSIS — I10 Essential (primary) hypertension: Secondary | ICD-10-CM | POA: Diagnosis present

## 2017-10-29 DIAGNOSIS — E785 Hyperlipidemia, unspecified: Secondary | ICD-10-CM | POA: Diagnosis not present

## 2017-10-29 DIAGNOSIS — M549 Dorsalgia, unspecified: Secondary | ICD-10-CM | POA: Diagnosis not present

## 2017-10-29 DIAGNOSIS — R000 Tachycardia, unspecified: Secondary | ICD-10-CM | POA: Insufficient documentation

## 2017-10-29 DIAGNOSIS — Z79899 Other long term (current) drug therapy: Secondary | ICD-10-CM | POA: Diagnosis not present

## 2017-10-29 DIAGNOSIS — Z8673 Personal history of transient ischemic attack (TIA), and cerebral infarction without residual deficits: Secondary | ICD-10-CM | POA: Diagnosis not present

## 2017-10-29 DIAGNOSIS — R29898 Other symptoms and signs involving the musculoskeletal system: Secondary | ICD-10-CM

## 2017-10-29 LAB — URINALYSIS, COMPLETE (UACMP) WITH MICROSCOPIC
Bacteria, UA: NONE SEEN
Bilirubin Urine: NEGATIVE
Glucose, UA: >=500 mg/dL — AB
Hgb urine dipstick: NEGATIVE
Ketones, ur: NEGATIVE mg/dL
Leukocytes, UA: NEGATIVE
Nitrite: NEGATIVE
Protein, ur: NEGATIVE mg/dL
Specific Gravity, Urine: 1.031 — ABNORMAL HIGH (ref 1.005–1.030)
pH: 6 (ref 5.0–8.0)

## 2017-10-29 LAB — BASIC METABOLIC PANEL
Anion gap: 8 (ref 5–15)
BUN: 8 mg/dL (ref 6–20)
CO2: 26 mmol/L (ref 22–32)
Calcium: 8.6 mg/dL — ABNORMAL LOW (ref 8.9–10.3)
Chloride: 102 mmol/L (ref 98–111)
Creatinine, Ser: 0.69 mg/dL (ref 0.44–1.00)
GFR calc Af Amer: >60 mL/min (ref >60)
GFR calc non Af Amer: >60 mL/min (ref >60)
Glucose, Bld: 318 mg/dL — ABNORMAL HIGH (ref 70–99)
Potassium: 3.6 mmol/L (ref 3.5–5.1)
Sodium: 136 mmol/L (ref 135–145)

## 2017-10-29 LAB — CBC WITH DIFFERENTIAL/PLATELET
Basophils Absolute: 0 10*3/uL (ref 0–0.1)
Basophils Relative: 0 %
Eosinophils Absolute: 0.1 10*3/uL (ref 0–0.7)
Eosinophils Relative: 1 %
HCT: 37.3 % (ref 35.0–47.0)
Hemoglobin: 12.2 g/dL (ref 12.0–16.0)
Lymphocytes Relative: 33 %
Lymphs Abs: 2.2 10*3/uL (ref 1.0–3.6)
MCH: 26.1 pg (ref 26.0–34.0)
MCHC: 32.7 g/dL (ref 32.0–36.0)
MCV: 79.9 fL — ABNORMAL LOW (ref 80.0–100.0)
Monocytes Absolute: 0.4 10*3/uL (ref 0.2–0.9)
Monocytes Relative: 6 %
Neutro Abs: 3.9 10*3/uL (ref 1.4–6.5)
Neutrophils Relative %: 60 %
Platelets: 305 10*3/uL (ref 150–440)
RBC: 4.67 MIL/uL (ref 3.80–5.20)
RDW: 15.3 % — ABNORMAL HIGH (ref 11.5–14.5)
WBC: 6.6 10*3/uL (ref 3.6–11.0)

## 2017-10-29 LAB — GLUCOSE, CAPILLARY: Glucose-Capillary: 313 mg/dL — ABNORMAL HIGH (ref 70–99)

## 2017-10-29 MED ORDER — IOPAMIDOL (ISOVUE-370) INJECTION 76%
75.0000 mL | Freq: Once | INTRAVENOUS | Status: AC | PRN
  Administered 2017-10-29: 75 mL via INTRAVENOUS

## 2017-10-29 MED ORDER — GADOBENATE DIMEGLUMINE 529 MG/ML IV SOLN
20.0000 mL | Freq: Once | INTRAVENOUS | Status: AC | PRN
  Administered 2017-10-29: 20 mL via INTRAVENOUS

## 2017-10-29 NOTE — ED Provider Notes (Addendum)
[facility] Emergency Department Provider Note  ____________________________________________  Time seen: Approximately 5:34 PM  I have reviewed the triage vital signs and the nursing notes.   HISTORY  Chief Complaint Extremity Weakness   HPI Michelle [name] is a 57 y.o. female with a history of diabetes, hypertension, hyperlipidemia, obesity, and prior stroke with no neurological deficits who presents for evaluation of bilateral leg weakness.  Patient reports that she was at church when she started having cramps in her left hip.  She tried to stand up to go to the bathroom and was unable to move both her legs. Her symptoms were sudden in onset and severe and have been constant. She reports normal sensation.  She is able to wiggle her toes and flex and extend her ankle but unable to move the rest of her leg.  She denies back pain, recent illness, recent vaccines, abdominal pain, chest pain, dizziness.   Past Medical History:  Diagnosis Date  . Diabetes mellitus   . Hyperlipemia   . Hypertension   . Obesity     Patient Active Problem List   Diagnosis Date Noted  . Weakness of both lower extremities 10/29/2017  . HTN (hypertension) 07/11/2010  . HLD (hyperlipidemia) 07/11/2010  . BACK PAIN WITH RADICULOPATHY 12/07/2009  . TINEA CRURIS 08/11/2009  . 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 CHEST PAIN, ATYPICAL 06/05/2009  . LEG PAIN, LEFT 04/02/2008  . Diabetes (HCC) 09/28/2007  . OBESITY 09/28/2007    Past Surgical History:  Procedure Laterality Date  . BTL    . CHOLECYSTECTOMY    . partial recection to colonsecondary to polyps    . TONSILECTOMY, ADENOIDECTOMY, BILATERAL MYRINGOTOMY AND TUBES      Prior to Admission medications   Medication Sig Start Date End Date Taking? Authorizing Provider  aliskiren (TEKTURNA) 300 MG tablet Take 300 mg by mouth daily.    [provider]  amLODipine (NORVASC) 5 MG tablet Take 10 mg by mouth  daily.    [provider]  cloNIDine (CATAPRES) 0.2 MG tablet Take 1 tablet (0.2 mg total) by mouth at bedtime. 07/06/10 07/06/11  Kerri PerchesSimpson, Margaret E, MD  hydrALAZINE (APRESOLINE) 50 MG tablet Take 1 tablet (50 mg total) by mouth 3 (three) times daily. 11/02/14   Emily FilbertWilliams, Jonathan E, MD  insulin aspart (NOVOLOG) 100 UNIT/ML injection Inject 20 Units into the skin 3 (three) times daily with meals.    [provider]  insulin detemir (LEVEMIR FLEXPEN) 100 UNIT/ML injection Inject into the skin at bedtime. Sliding scale    [provider]  Insulin Syringe-Needle U-100 (B-D INS SYR ULTRAFINE 1CC/30G) 30G X 1/2" 1 ML MISC by Does not apply route. Once daily use with insulin       [provider]  lisinopril-hydrochlorothiazide (PRINZIDE,ZESTORETIC) 20-12.5 MG per tablet Take 2 tablets by mouth daily. 07/06/10   Kerri PerchesSimpson, Margaret E, MD  metFORMIN (GLUCOPHAGE) 1000 MG tablet Take 1,000 mg by mouth 2 (two) times daily with a meal.    [provider]  rosuvastatin (CRESTOR) 10 MG tablet Take 1 tablet (10 mg total) by mouth at bedtime. 07/06/10 07/06/11  Kerri PerchesSimpson, Margaret E, MD  Saxagliptin-Metformin (KOMBIGLYZE XR) 2.07-998 MG TB24 Take 2 tablets by mouth daily. 07/06/10   Kerri PerchesSimpson, Margaret E, MD  valsartan (DIOVAN) 320 MG tablet Take 320 mg by mouth daily. 10/13/15   [provider]    Allergies Patient has no known allergies.  Family History  Problem Relation Age of  Onset  . Heart attack Father   . Hypertension Father   . Diabetes Father   . Stroke Father   . Heart disease Father   . Hypertension Mother   . Obesity Mother   . Heart disease Mother   . Leukemia Brother   . Diabetes Sister   . Hypertension Sister   . Diabetes Sister   . Hypertension Sister   . Asthma Sister   . Hypertension Sister     Social History Social History   Tobacco Use  . Smoking status: Never Smoker  . Smokeless tobacco: Never Used  Substance Use Topics  . Alcohol  use: No  . Drug use: No    Review of Systems  Constitutional: Negative for fever. Eyes: Negative for visual changes. ENT: Negative for sore throat. Neck: No neck pain  Cardiovascular: Negative for chest pain. Respiratory: Negative for shortness of breath. Gastrointestinal: Negative for abdominal pain, vomiting or diarrhea. Genitourinary: Negative for dysuria. Musculoskeletal: Negative for back pain. Skin: Negative for rash. Neurological: Negative for headaches. B/l leg weakness Psych: No SI or HI  ____________________________________________   PHYSICAL EXAM:  VITAL SIGNS: ED Triage Vitals [10/29/17 1708]  Enc Vitals Group     BP (!) 174/82     Pulse Rate (!) 110     Resp      Temp 99.4 F (37.4 C)     Temp Source Oral     SpO2 98 %     Weight 259 lb (117.5 kg)     Height 5\' 4"  (1.626 m)     Head Circumference      Peak Flow      Pain Score 0     Pain Loc      Pain Edu?      Excl. in GC?     Constitutional: Alert and oriented. Well appearing and in no apparent distress. HEENT:      Head: Normocephalic and atraumatic.         Eyes: Conjunctivae are normal. Sclera is non-icteric.       Mouth/Throat: Mucous membranes are moist.       Neck: Supple with no signs of meningismus. Cardiovascular: Regular rate and rhythm. No murmurs, gallops, or rubs. 2+ symmetrical distal pulses are present in all extremities. No JVD. Respiratory: Normal respiratory effort. Lungs are clear to auscultation bilaterally. No wheezes, crackles, or rhonchi.  Gastrointestinal: Soft, non tender, and non distended with positive bowel sounds. No rebound or guarding.  Normal rectal tone Musculoskeletal: Bilateral lower extremities are warm and well perfused with strong intact distal pulses, brisk capillary few.  Patient has mild tenderness to palpation midline of the lumbar spine.  neurologic: Normal speech and language. Face is symmetric.  Intact sensation x4, intact strength on upper extremities,  patient is able to dorsiflex and plantarflex bilateral feet and able to wiggle her toes but unable to move her legs otherwise.  Absent DTRs in bilateral lower extremities. No gross focal neurologic deficits are appreciated. Skin: Skin is warm, dry and intact. No rash noted. Psychiatric: Mood and affect are normal. Speech and behavior are normal.  ____________________________________________   LABS (all labs ordered are listed, but only abnormal results are displayed)  Labs Reviewed  CBC WITH DIFFERENTIAL/PLATELET - Abnormal; Notable for the following components:      Result Value   MCV 79.9 (*)    RDW 15.3 (*)    All other components within normal limits  BASIC METABOLIC PANEL - Abnormal; Notable for the  following components:   Glucose, Bld 318 (*)    Calcium 8.6 (*)    All other components within normal limits  URINALYSIS, COMPLETE (UACMP) WITH MICROSCOPIC - Abnormal; Notable for the following components:   Color, Urine STRAW (*)    APPearance CLEAR (*)    Specific Gravity, Urine 1.031 (*)    Glucose, UA >=500 (*)    All other components within normal limits  GLUCOSE, CAPILLARY - Abnormal; Notable for the following components:   Glucose-Capillary 313 (*)    All other components within normal limits   ____________________________________________  EKG  ED ECG REPORT I, Nita Sickle, the attending physician, personally viewed and interpreted this ECG.  Sinus tachycardia, rate of 116, normal intervals, normal axis, no ST elevations or depressions. ____________________________________________  RADIOLOGY  I have personally reviewed the images performed during this visit and I agree with the Radiologist's read.   Interpretation by Radiologist:  Mr Lumbar Spine W Wo Contrast  Result Date: 10/29/2017 CLINICAL DATA:  Back pain. Cauda equina syndrome. Unable to move lower extremities. EXAM: MRI LUMBAR SPINE WITHOUT AND WITH CONTRAST TECHNIQUE: Multiplanar and multiecho pulse  sequences of the lumbar spine were obtained without and with intravenous contrast. CONTRAST:  20mL MULTIHANCE GADOBENATE DIMEGLUMINE 529 MG/ML IV SOLN COMPARISON:  MRI of lumbar spine 12/15/2009 FINDINGS: Segmentation: 5 non rib-bearing lumbar type vertebral bodies are present. Lowest fully formed vertebral body is L5. Alignment:  AP alignment is anatomic. Vertebrae: Marrow signal and vertebral body heights normal. There is no pathologic enhancement. Conus medullaris and cauda equina: Conus extends to the L1 level. Conus and cauda equina appear normal. Paraspinal and other soft tissues: A simple cyst of the right kidney posteriorly measures 3 cm. No other focal lesions are present. There is no significant adenopathy. Paraspinous musculature is within normal limits. Disc levels: T12-L1: Negative. L1-2: Facet hypertrophy is present. No significant disc protrusion or stenosis is present. L2-3: A broad-based disc protrusion is present. There is interval progression. The disc extends into the neural foramina bilaterally. Central canal is patent. Progressive mild foraminal narrowing worse on the right. L3-4: A broad-based disc protrusion is present. A shallow left annular tear is noted. Mild left subarticular narrowing present. The disc extends into both neural foramina with mild foraminal narrowing, left greater than right. L4-5: A rightward disc protrusion is similar the prior exam. Central canal is patent. Mild right foraminal narrowing is unchanged. L5-S1: Mild facet hypertrophy is noted bilaterally without significant change. No focal disc protrusion or stenosis is present. IMPRESSION: 1. Slight progression of mild foraminal narrowing bilaterally at L2-3, right greater than left. 2. No acute abnormality. 3. Broad-based disc protrusion at L3-4 is stable with mild left subarticular and bilateral foraminal narrowing, left greater than right. 4. Mild right foraminal narrowing at L4-5 is stable. Electronically Signed    By: Marin Roberts M.D.   On: 10/29/2017 19:54   Ct Angio Chest/abd/pel For Dissection W And/or Wo Contrast  Result Date: 10/29/2017 CLINICAL DATA:  BILATERAL leg weakness, cramping in upper LEFT leg since 1530 hours progressively moving down leg, history hypertension, diabetes mellitus EXAM: CT ANGIOGRAPHY CHEST, ABDOMEN AND PELVIS TECHNIQUE: Multidetector CT imaging through the chest, abdomen and pelvis was performed using the standard protocol during bolus administration of intravenous contrast. Multiplanar reconstructed images and MIPs were obtained and reviewed to evaluate the vascular anatomy. CONTRAST:  75mL ISOVUE-370 IOPAMIDOL (ISOVUE-370) INJECTION 76% IV COMPARISON:  None FINDINGS: CTA CHEST FINDINGS Cardiovascular: Scattered atherosclerotic calcifications aorta, coronary arteries and proximal  great vessels. No intramural hematoma seen on precontrast imaging. Normal aortic enhancement following contrast without aneurysm or dissection. Pulmonary arteries grossly patent on non targeted study. No pericardial effusion. Mediastinum/Nodes: Base of cervical region normal appearance. Esophagus unremarkable. No thoracic adenopathy. Lungs/Pleura: Lungs clear. No infiltrate, pleural effusion, pneumothorax or mass/nodule. Mild scattered degenerative disc disease changes thoracic spine. Musculoskeletal: No acute osseous abnormalities. Degenerative disc disease changes thoracic spine Review of the MIP images confirms the above findings. CTA ABDOMEN AND PELVIS FINDINGS VASCULAR Aorta: Normal caliber with scattered atherosclerotic calcifications. No aneurysm or dissection. Celiac: Widely patent SMA: Widely patent Renals: Calcified atherosclerotic plaque at proximal RIGHT renal artery, with less than 50% diameter narrowing. LEFT renal artery widely patent. Accessory LEFT renal artery to upper pole. IMA: Patent Inflow: Mild scattered atherosclerotic calcifications of the iliac systems bilaterally Veins: Main and  LEFT/RIGHT portal veins grossly patent. Splenic vein patent. IVC and SMV unopacified Review of the MIP images confirms the above findings. NON-VASCULAR Hepatobiliary: Post cholecystectomy.  Liver unremarkable. Pancreas: Normal appearance Spleen: Normal appearance.  Small splenule at splenic hilum. Adrenals/Urinary Tract: RIGHT renal cyst 3.2 x 2.6 cm image 96. Adrenal glands, kidneys, ureters, and well distended bladder otherwise normal. Stomach/Bowel: Appendix not visualized question resected. Stomach and bowel loops unremarkable. Lymphatic: No adenopathy. Few scattered normal sized inguinal and retroperitoneal nodes. Reproductive: Unremarkable uterus and adnexa Other: No free air or free fluid. Small umbilical and supraumbilical ventral hernias containing fat Musculoskeletal: No acute osseous findings. Review of the MIP images confirms the above findings. IMPRESSION: Scattered atherosclerotic calcifications of aorta, coronary arteries, proximal great vessels and iliac arteries. No evidence of aortic aneurysm or dissection. Less than 50% diameter narrowing by calcified plaque at proximal RIGHT renal artery. Accessory LEFT renal artery noted. Small RIGHT renal cyst. Small umbilical and supraumbilical ventral hernias containing fat. No acute intrathoracic, intra-abdominal or intrapelvic abnormalities. Electronically Signed   By: Ulyses Southward M.D.   On: 10/29/2017 18:24      ____________________________________________   PROCEDURES  Procedure(s) performed: None Procedures Critical Care performed:  None ____________________________________________   INITIAL IMPRESSION / ASSESSMENT AND PLAN / ED COURSE   57 y.o. female with a history of diabetes, hypertension, hyperlipidemia, obesity, and prior stroke with no neurological deficits who presents for evaluation of bilateral leg weakness.  Patient is able to move her foot and ankle but unable to move the rest of her legs, she has normal sensation, strong  equal pulses and brisk capillary refill bilaterally, she has no DTRs, normal rectal tone.  CT angiogram done to rule out dissection which is negative.  She does have mild lumbar tenderness on palpation but denies any back pain into a pressed on her back.  We will send for an MRI to rule out cauda equina.  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   Clinical Course as of Oct 29 2040  Wynelle Link Oct 29, 2017  2012 Pain negative for dissection.  MRI negative for cauda equina.  Patient now is able to have very minimal flexion of bilateral knees but still unable to move her legs and unable to walk.  Unclear etiology of her symptoms.  Will admit to the hospitalist service for neurology evaluation.   [CV]  2038 Daughter just came out of the room saying that patient is now able to move both her legs and her symptoms resolved. Patient is able to move both legs. Will ambulate and if  she is able to walk will dc home and refer to neurology outpatient for evaluation.   [CV]    Clinical Course User Index [CV] Don Perking [place], MD     As part of my medical decision making, I reviewed the following data within the electronic MEDICAL RECORD NUMBER Nursing notes reviewed and incorporated, Labs reviewed , EKG interpreted , Old EKG reviewed, Old chart reviewed, Radiograph reviewed , Discussed with admitting physician , Notes from prior ED visits and [place] Controlled Substance Database    Pertinent labs & imaging results that were available during my care of the patient were reviewed by me and considered in my medical decision making (see chart for details).    ____________________________________________   FINAL CLINICAL IMPRESSION(S) / ED DIAGNOSES  Final diagnoses:  Bilateral leg weakness      NEW MEDICATIONS STARTED DURING THIS VISIT:  ED Discharge Orders    None       Note:  This document was prepared using Dragon voice  recognition software and may include unintentional dictation errors.    Nita Sickle, MD 10/29/17 2014    Don Perking, [place], MD 10/29/17 [phone]

## 2017-10-29 NOTE — ED Notes (Signed)
Ambulated pt around room successfully.

## 2017-10-29 NOTE — ED Notes (Signed)
Patient transported to MRI by RN Alvino ChapelJo

## 2017-10-29 NOTE — ED Notes (Signed)
Shift change. Kadeja accompanying pt and MRI tech.

## 2017-10-29 NOTE — ED Triage Notes (Signed)
PT [place] from church via ems with concerns over bilateral leg weakness. Pt reports she had a cramp in her upper left leg that started at 1530 today. Pt states the cramps progressively moved down her leg and then she felt like she could not move her lower extremities. Pt alert & oriented x 4 upon arrival, no facial droop, grips equal and strong, pt able to move toes and feels sensation on both sides.

## 2017-10-29 NOTE — Discharge Instructions (Addendum)
Your evaluation was unremarkable in the emergency room and we are unable to identify a cause of your bilateral leg weakness. Please follow-up with the neurologist for further evaluation.  Return to the emergency room if you have any further episodes of weakness, difficulty breathing, facial droop, headache, slurred speech

## 2019-05-06 DIAGNOSIS — E11311 Type 2 diabetes mellitus with unspecified diabetic retinopathy with macular edema: Secondary | ICD-10-CM | POA: Diagnosis not present

## 2019-05-06 DIAGNOSIS — E113293 Type 2 diabetes mellitus with mild nonproliferative diabetic retinopathy without macular edema, bilateral: Secondary | ICD-10-CM | POA: Diagnosis not present

## 2019-05-06 DIAGNOSIS — H3581 Retinal edema: Secondary | ICD-10-CM | POA: Diagnosis not present

## 2019-05-20 DIAGNOSIS — H3581 Retinal edema: Secondary | ICD-10-CM | POA: Diagnosis not present

## 2019-05-20 DIAGNOSIS — E11311 Type 2 diabetes mellitus with unspecified diabetic retinopathy with macular edema: Secondary | ICD-10-CM | POA: Diagnosis not present

## 2019-06-12 DIAGNOSIS — Z794 Long term (current) use of insulin: Secondary | ICD-10-CM | POA: Diagnosis not present

## 2019-06-12 DIAGNOSIS — E1121 Type 2 diabetes mellitus with diabetic nephropathy: Secondary | ICD-10-CM | POA: Diagnosis not present

## 2019-06-12 DIAGNOSIS — R0683 Snoring: Secondary | ICD-10-CM | POA: Diagnosis not present

## 2019-06-12 DIAGNOSIS — M25561 Pain in right knee: Secondary | ICD-10-CM | POA: Diagnosis not present

## 2019-06-12 DIAGNOSIS — I10 Essential (primary) hypertension: Secondary | ICD-10-CM | POA: Diagnosis not present

## 2019-07-01 DIAGNOSIS — E11311 Type 2 diabetes mellitus with unspecified diabetic retinopathy with macular edema: Secondary | ICD-10-CM | POA: Diagnosis not present

## 2019-07-01 DIAGNOSIS — H3581 Retinal edema: Secondary | ICD-10-CM | POA: Diagnosis not present

## 2019-07-03 DIAGNOSIS — E113293 Type 2 diabetes mellitus with mild nonproliferative diabetic retinopathy without macular edema, bilateral: Secondary | ICD-10-CM | POA: Diagnosis not present

## 2019-07-03 DIAGNOSIS — H3581 Retinal edema: Secondary | ICD-10-CM | POA: Diagnosis not present

## 2019-07-03 DIAGNOSIS — H43392 Other vitreous opacities, left eye: Secondary | ICD-10-CM | POA: Diagnosis not present

## 2019-07-03 DIAGNOSIS — E11311 Type 2 diabetes mellitus with unspecified diabetic retinopathy with macular edema: Secondary | ICD-10-CM | POA: Diagnosis not present

## 2019-08-07 DIAGNOSIS — E11311 Type 2 diabetes mellitus with unspecified diabetic retinopathy with macular edema: Secondary | ICD-10-CM | POA: Diagnosis not present

## 2019-08-07 DIAGNOSIS — H3581 Retinal edema: Secondary | ICD-10-CM | POA: Diagnosis not present

## 2019-09-16 DIAGNOSIS — H3581 Retinal edema: Secondary | ICD-10-CM | POA: Diagnosis not present

## 2019-09-16 DIAGNOSIS — E11311 Type 2 diabetes mellitus with unspecified diabetic retinopathy with macular edema: Secondary | ICD-10-CM | POA: Diagnosis not present

## 2019-09-25 DIAGNOSIS — E1121 Type 2 diabetes mellitus with diabetic nephropathy: Secondary | ICD-10-CM | POA: Diagnosis not present

## 2019-09-25 DIAGNOSIS — Z794 Long term (current) use of insulin: Secondary | ICD-10-CM | POA: Diagnosis not present

## 2019-10-02 DIAGNOSIS — Z794 Long term (current) use of insulin: Secondary | ICD-10-CM | POA: Diagnosis not present

## 2019-10-02 DIAGNOSIS — E1121 Type 2 diabetes mellitus with diabetic nephropathy: Secondary | ICD-10-CM | POA: Diagnosis not present

## 2019-10-02 DIAGNOSIS — I10 Essential (primary) hypertension: Secondary | ICD-10-CM | POA: Diagnosis not present

## 2019-10-16 DIAGNOSIS — Z794 Long term (current) use of insulin: Secondary | ICD-10-CM | POA: Diagnosis not present

## 2019-10-16 DIAGNOSIS — I10 Essential (primary) hypertension: Secondary | ICD-10-CM | POA: Diagnosis not present

## 2019-10-16 DIAGNOSIS — E1121 Type 2 diabetes mellitus with diabetic nephropathy: Secondary | ICD-10-CM | POA: Diagnosis not present

## 2019-10-28 DIAGNOSIS — H3581 Retinal edema: Secondary | ICD-10-CM | POA: Diagnosis not present

## 2019-10-28 DIAGNOSIS — E11311 Type 2 diabetes mellitus with unspecified diabetic retinopathy with macular edema: Secondary | ICD-10-CM | POA: Diagnosis not present

## 2019-10-30 DIAGNOSIS — H3581 Retinal edema: Secondary | ICD-10-CM | POA: Diagnosis not present

## 2019-10-30 DIAGNOSIS — E11311 Type 2 diabetes mellitus with unspecified diabetic retinopathy with macular edema: Secondary | ICD-10-CM | POA: Diagnosis not present

## 2022-09-21 ENCOUNTER — Encounter: Payer: Self-pay | Admitting: Emergency Medicine

## 2022-09-21 ENCOUNTER — Other Ambulatory Visit: Payer: Self-pay

## 2022-09-21 ENCOUNTER — Emergency Department: Payer: 59

## 2022-09-21 ENCOUNTER — Emergency Department
Admission: EM | Admit: 2022-09-21 | Discharge: 2022-09-21 | Disposition: A | Discharge status: Home / Self Care | Payer: 59 | Attending: Emergency Medicine | Admitting: Emergency Medicine

## 2022-09-21 DIAGNOSIS — E119 Type 2 diabetes mellitus without complications: Secondary | ICD-10-CM | POA: Diagnosis not present

## 2022-09-21 DIAGNOSIS — I10 Essential (primary) hypertension: Secondary | ICD-10-CM | POA: Diagnosis not present

## 2022-09-21 DIAGNOSIS — R2242 Localized swelling, mass and lump, left lower limb: Secondary | ICD-10-CM | POA: Insufficient documentation

## 2022-09-21 DIAGNOSIS — R209 Unspecified disturbances of skin sensation: Secondary | ICD-10-CM | POA: Diagnosis not present

## 2022-09-21 DIAGNOSIS — R600 Localized edema: Secondary | ICD-10-CM

## 2022-09-21 LAB — BASIC METABOLIC PANEL
Anion gap: 10 (ref 5–15)
BUN: 10 mg/dL (ref 8–23)
CO2: 25 mmol/L (ref 22–32)
Calcium: 9 mg/dL (ref 8.9–10.3)
Chloride: 103 mmol/L (ref 98–111)
Creatinine, Ser: 0.6 mg/dL (ref 0.44–1.00)
GFR, Estimated: >60 mL/min (ref >60)
Glucose, Bld: 120 mg/dL — ABNORMAL HIGH (ref 70–99)
Potassium: 3.8 mmol/L (ref 3.5–5.1)
Sodium: 138 mmol/L (ref 135–145)

## 2022-09-21 LAB — CBC
HCT: 39.2 % (ref 36.0–46.0)
Hemoglobin: 12.2 g/dL (ref 12.0–15.0)
MCH: 25.6 pg — ABNORMAL LOW (ref 26.0–34.0)
MCHC: 31.1 g/dL (ref 30.0–36.0)
MCV: 82.2 fL (ref 80.0–100.0)
Platelets: 318 10*3/uL (ref 150–400)
RBC: 4.77 MIL/uL (ref 3.87–5.11)
RDW: 16 % — ABNORMAL HIGH (ref 11.5–15.5)
WBC: 7.6 10*3/uL (ref 4.0–10.5)
nRBC: 0 % (ref 0.0–0.2)

## 2022-09-21 LAB — BRAIN NATRIURETIC PEPTIDE: B Natriuretic Peptide: 31.7 pg/mL (ref 0.0–100.0)

## 2022-09-21 NOTE — Discharge Instructions (Addendum)
Your exam, labs, EKG, chest x-ray and ultrasound are all normal and reassuring.  No signs of an occlusive clot to the left leg.  No lab abnormalities noted today, your chest x-ray is negative for any signs of congestive heart failure.  You should rest with leg elevated as needed and consider cool or warm compresses to help reduce nerve irritation.  Follow-up with your primary provider for ongoing concerns.  Return to the ED if needed.

## 2022-09-21 NOTE — ED Provider Notes (Signed)
[facility] Emergency Department Provider Note     Event Date/Time   First MD Initiated Contact with Patient 09/21/22 1211     (approximate)   History   Leg Swelling   HPI  Michelle [name] is a 62 y.o. female with a history of diabetes, diabetic neuropathy, obesity, HTN, and HLD, presents to the ED for evaluation of L LE swelling.  Patient reports swelling she noted yesterday to her left lower leg from the knee to the foot.  She would also endorse some paresthesias in the same region, anteriorly.  She denies any recent injury, trauma, or fall.  She also denies any chest pain, shortness of breath, cough, or hemoptysis.  Patient with a recent RUE carpal tunnel release performed on Friday.  She denies any complications related to the procedure.     Physical Exam   Triage Vital Signs: ED Triage Vitals  Encounter Vitals Group     BP 09/21/22 1137 (!) 149/83     Systolic BP Percentile --      Diastolic BP Percentile --      Pulse Rate 09/21/22 1137 90     Resp 09/21/22 1137 (!) 97     Temp 09/21/22 1137 98.5 F (36.9 C)     Temp Source 09/21/22 1137 Oral     SpO2 09/21/22 1137 97 %     Weight 09/21/22 1137 259 lb 0.7 oz (117.5 kg)     Height 09/21/22 1137 5\' 4"  (1.626 m)     Head Circumference --      Peak Flow --      Pain Score 09/21/22 1136 10     Pain Loc --      Pain Education --      Exclude from Growth Chart --     Most recent vital signs: Vitals:   09/21/22 1137  BP: (!) 149/83  Pulse: 90  Resp: 18  Temp: 98.5 F (36.9 C)  SpO2: 97%    General Awake, no distress. NAD CV:  Good peripheral perfusion. RRR.  1+ pitting edema BLE.  No CCE distally. RESP:  Normal effort. CTA ABD:  No distention.  MSK:  LLE without obvious edema, ecchymosis, or soft tissue swelling.  No joint effusions appreciated.  Mild soft tissue swelling noted to the distal ankle.  Normal active range of motion of the left lower extremity.   ED Results /  Procedures / Treatments   Labs (all labs ordered are listed, but only abnormal results are displayed) Labs Reviewed  BASIC METABOLIC PANEL - Abnormal; Notable for the following components:      Result Value   Glucose, Bld 120 (*)    All other components within normal limits  CBC - Abnormal; Notable for the following components:   MCH 25.6 (*)    RDW 16.0 (*)    All other components within normal limits  BRAIN NATRIURETIC PEPTIDE     EKG  Vent. rate 81 BPM PR interval 142 ms QRS duration 82 ms QT/QTcB 384/446 ms P-R-T axes 72 -45 34 Normal sinus rhythm Left anterior fascicular block Septal infarct , age  RADIOLOGY  I personally viewed and evaluated these images as part of my medical decision making, as well as reviewing the written report by the radiologist.  ED Provider Interpretation: No acute findings  DG Chest 2 View  Result Date: 09/21/2022 CLINICAL DATA:  Lower extremity edema.  Left lower leg swelling. EXAM: CHEST - 2 VIEW COMPARISON:  08/16/2005 FINDINGS: The heart size and mediastinal contours are within normal limits. Both lungs are clear. The visualized skeletal structures are unremarkable. Surgical clips in the right upper quadrant. IMPRESSION: No active cardiopulmonary disease. Electronically Signed   By: Burman Nieves M.D.   On: 09/21/2022 15:12   US Venous Img Lower Unilateral Left (DVT)  Result Date: 09/21/2022 CLINICAL DATA:  62 year old female history left lower extremity swelling. EXAM: LEFT LOWER EXTREMITY VENOUS DOPPLER ULTRASOUND TECHNIQUE: Gray-scale sonography with graded compression, as well as color Doppler and duplex ultrasound were performed to evaluate the left lower extremity deep venous systems from the level of the common femoral vein and including the common femoral, femoral, profunda femoral, popliteal and calf veins including the posterior tibial, peroneal and gastrocnemius veins when visible. Spectral Doppler was utilized to evaluate flow  at rest and with distal augmentation maneuvers in the common femoral, femoral and popliteal veins. The contralateral common femoral vein was also evaluated for comparison. COMPARISON:  04/02/2008 FINDINGS: LEFT LOWER EXTREMITY Common Femoral Vein: No evidence of thrombus. Normal compressibility, respiratory phasicity and response to augmentation. Central Greater Saphenous Vein: No evidence of thrombus. Normal compressibility and flow on color Doppler imaging. Central Profunda Femoral Vein: No evidence of thrombus. Normal compressibility and flow on color Doppler imaging. Femoral Vein: No evidence of thrombus. Normal compressibility, respiratory phasicity and response to augmentation. Popliteal Vein: No evidence of thrombus. Normal compressibility, respiratory phasicity and response to augmentation. Calf Veins: No evidence of thrombus. Normal compressibility and flow on color Doppler imaging. Other Findings:  None. RIGHT LOWER EXTREMITY Common Femoral Vein: No evidence of thrombus. Normal compressibility, respiratory phasicity and response to augmentation. IMPRESSION: No evidence of left lower extremity deep venous thrombosis. Marliss Coots, MD [facility] Electronically Signed   By: Marliss Coots M.D.   On: 09/21/2022 13:12     PROCEDURES:  Critical Care performed: No  Procedures   MEDICATIONS ORDERED IN ED: Medications - No data to display   IMPRESSION / MDM / ASSESSMENT AND PLAN / ED COURSE  I reviewed the triage vital signs and the nursing notes.                              Differential diagnosis includes, but is not limited to, DVT, peripheral edema, CHF, distal paresthesias  Patient's presentation is most consistent with acute presentation with potential threat to life or bodily function.  Patient's diagnosis is consistent with LLE peripheral edema.  No lab abnormalities noted.  Patient with a reassuring BMP.  Ultrasound of the  left likely does not reveal any acute obstructive process.  Chest x-ray with evidence of cardiomegaly or pulmonary edema.  We discussed treatment options including topical analgesics, anti-inflammatories, and medicines for peripheral neuropathy.  Patient declined any prescriptions at this time.  Patient is to follow up with her PCP as discussed, as needed or otherwise directed. Patient is given ED precautions to return to the ED for any worsening or new symptoms.     FINAL CLINICAL IMPRESSION(S) / ED DIAGNOSES   Final diagnoses:  Leg edema     Rx / DC Orders   ED Discharge Orders     None        Note:  This document was prepared using Dragon voice recognition software and may include unintentional dictation errors.    Lissa Hoard, PA-C 09/21/22 1654    Minna Antis, MD 09/21/22 [phone]

## 2022-09-21 NOTE — ED Triage Notes (Addendum)
C/O left lower leg swelling.  Also swelling to foot.  Noticed swelling yesterday.  Also  c/o leg feeling hot in shin area.  Carpal Tunnel release to right hand on Friday.  + DP palpable.  +1 pitting edema to foot.

## 2022-09-21 NOTE — ED Notes (Signed)
Lavender, light green and light blue tubes sent to lab.
